# Patient Record
Sex: Female | Born: 1990
Health system: Southern US, Community
[De-identification: ages and names within clinical notes are randomized; demographics above are authoritative.]

## PROBLEM LIST (undated history)

## (undated) ENCOUNTER — Inpatient Hospital Stay (HOSPITAL_COMMUNITY): Payer: Self-pay

## (undated) DIAGNOSIS — F32A Depression, unspecified: Secondary | ICD-10-CM

## (undated) DIAGNOSIS — F329 Major depressive disorder, single episode, unspecified: Secondary | ICD-10-CM

## (undated) DIAGNOSIS — R51 Headache: Secondary | ICD-10-CM

## (undated) DIAGNOSIS — F419 Anxiety disorder, unspecified: Secondary | ICD-10-CM

## (undated) DIAGNOSIS — R519 Headache, unspecified: Secondary | ICD-10-CM

## (undated) DIAGNOSIS — T7840XA Allergy, unspecified, initial encounter: Secondary | ICD-10-CM

## (undated) DIAGNOSIS — R87619 Unspecified abnormal cytological findings in specimens from cervix uteri: Secondary | ICD-10-CM

## (undated) DIAGNOSIS — K589 Irritable bowel syndrome without diarrhea: Secondary | ICD-10-CM

## (undated) DIAGNOSIS — L408 Other psoriasis: Secondary | ICD-10-CM

## (undated) HISTORY — DX: Allergy, unspecified, initial encounter: T78.40XA

## (undated) HISTORY — PX: OTHER SURGICAL HISTORY: SHX169

## (undated) HISTORY — PX: TONSILLECTOMY: SUR1361

## (undated) HISTORY — PX: WISDOM TOOTH EXTRACTION: SHX21

## (undated) HISTORY — DX: Headache: R51

## (undated) HISTORY — DX: Headache, unspecified: R51.9

## (undated) HISTORY — DX: Depression, unspecified: F32.A

## (undated) HISTORY — DX: Anxiety disorder, unspecified: F41.9

## (undated) HISTORY — DX: Irritable bowel syndrome, unspecified: K58.9

## (undated) HISTORY — DX: Major depressive disorder, single episode, unspecified: F32.9

## (undated) HISTORY — DX: Other psoriasis: L40.8

## (undated) HISTORY — DX: Unspecified abnormal cytological findings in specimens from cervix uteri: R87.619

## (undated) NOTE — *Deleted (*Deleted)
Pt reports weight incorrectly input into the computere 7 lbs 10.4 oz.

---
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
## 2011-11-14 DIAGNOSIS — F339 Major depressive disorder, recurrent, unspecified: Secondary | ICD-10-CM | POA: Insufficient documentation

## 2012-05-07 ENCOUNTER — Other Ambulatory Visit: Payer: Self-pay | Admitting: Certified Nurse Midwife

## 2012-05-07 NOTE — Telephone Encounter (Signed)
Request for Junel from pharmacy patient last seen 05/18/11 not scheduled for this year. 1 month supply given.

## 2012-05-07 NOTE — Telephone Encounter (Signed)
Will need to schedule exam before any other refills

## 2012-06-10 ENCOUNTER — Other Ambulatory Visit: Payer: Self-pay | Admitting: Certified Nurse Midwife

## 2012-06-10 NOTE — Telephone Encounter (Signed)
Aex scheduled for 06/25/2012 @ 2:00. Junel Fe 1/20 #28/0 refills sent to pharmacy for pt to maintain until aex.

## 2012-06-21 ENCOUNTER — Encounter: Payer: Self-pay | Admitting: Certified Nurse Midwife

## 2012-06-25 ENCOUNTER — Encounter: Payer: Self-pay | Admitting: Certified Nurse Midwife

## 2012-06-25 ENCOUNTER — Ambulatory Visit (INDEPENDENT_AMBULATORY_CARE_PROVIDER_SITE_OTHER): Payer: BC Managed Care – PPO | Admitting: Certified Nurse Midwife

## 2012-06-25 VITALS — BP 100/60 | HR 60 | Resp 16 | Ht 67.75 in | Wt 151.0 lb

## 2012-06-25 DIAGNOSIS — Z01419 Encounter for gynecological examination (general) (routine) without abnormal findings: Secondary | ICD-10-CM

## 2012-06-25 DIAGNOSIS — Z309 Encounter for contraceptive management, unspecified: Secondary | ICD-10-CM

## 2012-06-25 DIAGNOSIS — Z0000 Encounter for general adult medical examination without abnormal findings: Secondary | ICD-10-CM

## 2012-06-25 DIAGNOSIS — IMO0001 Reserved for inherently not codable concepts without codable children: Secondary | ICD-10-CM

## 2012-06-25 MED ORDER — NORETHIN ACE-ETH ESTRAD-FE 1-20 MG-MCG PO TABS: ORAL_TABLET | ORAL | Status: DC

## 2012-06-25 NOTE — Progress Notes (Signed)
22 y.o. G0P0000 Single Caucasian Fe here for annual exam.  Periods normal. Contraception working well and desires continuance.  Not sexually active. No STD screening needed. Complaining of occasional hard stool with bleeding only at time of stool, then subsides, no pain or diarrhea.  Has increased fiber in diet and water daily with some change. Psoriasis flare in vaginal area today.  Has medication for with dermatology. Medication stable for anxiety and depression with PCP. No other health issues today.   Patient's last menstrual period was 06/03/2012.          Sexually active: no  The current method of family planning is OCP (estrogen/progesterone).    Exercising: yes  weights & cardio Smoker:  no  Health Maintenance: Pap:  none MMG:  none Colonoscopy:  none BMD:   none TDaP:  2010 Labs: none Self breast exam: occ   reports that she has never smoked. She does not have any smokeless tobacco history on file. She reports that she drinks about 0.5 ounces of alcohol per week. She reports that she does not use illicit drugs.  Past Medical History  Diagnosis Date  . Inverse psoriasis   . IBS (irritable bowel syndrome)     History reviewed. No pertinent past surgical history.  Current Outpatient Prescriptions  Medication Sig Dispense Refill  . Acetaminophen (TYLENOL PO) Take by mouth as needed.      . Betamethasone Valerate 0.12 % foam daily.      Marland Kitchen FLUoxetine HCl (PROZAC PO) Take 30 mg by mouth daily.       . Ibuprofen (ADVIL PO) Take by mouth as needed.      Colleen Can FE 1/20 1-20 MG-MCG tablet TAKE 1 TABLET BY MOUTH EVERY DAY  28 tablet  0  . lamoTRIgine (LAMICTAL) 100 MG tablet Take 100 mg by mouth daily.       No current facility-administered medications for this visit.    Family History  Problem Relation Age of Onset  . Cancer Father     skin  . Cancer Maternal Grandmother     liver  . Hypertension Paternal Grandmother     ROS:  Pertinent items are noted in HPI.   Otherwise, a comprehensive ROS was negative.  Exam:   BP 100/60  Pulse 60  Resp 16  Ht 5' 7.75" (1.721 m)  Wt 151 lb (68.493 kg)  BMI 23.13 kg/m2  LMP 06/03/2012 Height: 5' 7.75" (172.1 cm)  Ht Readings from Last 3 Encounters:  06/25/12 5' 7.75" (1.721 m)    General appearance: alert, cooperative and appears stated age Head: Normocephalic, without obvious abnormality, atraumatic Neck: no adenopathy, supple, symmetrical, trachea midline and thyroid normal to inspection and palpation Lungs: clear to auscultation bilaterally Breasts: normal appearance, no masses or tenderness, No nipple retraction or dimpling, No nipple discharge or bleeding, No axillary or supraclavicular adenopathy Heart: regular rate and rhythm Abdomen: soft, non-tender; no masses,  no organomegaly Extremities: extremities normal, atraumatic, no cyanosis or edema Skin: Skin color, texture, turgor normal. No rashes or lesions Lymph nodes: Cervical, supraclavicular, and axillary nodes normal. No abnormal inguinal nodes palpated Neurologic: Grossly normal   Pelvic: External genitalia:  no lesions              Urethra:  normal appearing urethra with no masses, tenderness or lesions              Bartholin's and Skene's: normal  Vagina: normal appearing vagina with normal color and discharge, no lesions              Cervix: normal , non tender              Pap taken: yes Bimanual Exam:  Uterus:  normal size, contour, position, consistency, mobility, non-tender and anteverted              Adnexa: normal adnexa and no mass, fullness, tenderness               Rectovaginal: Confirms               Anus:  normal sphincter tone, no lesions  A:  Well Woman with normal exam  Contraception: OCP   Psoriasis history with dermatology management  Anxiety/Depression stable medication    P:  Reviewed health and wellness pertinent to exam.  Rx Junel 1/20 Fe see order   Continue follow up as indicated     Pap  smear as per guidelines  pap smear taken today  counseled on breast self exam, STD prevention, use and side effects of OCP's, adequate intake of calcium and vitamin D, diet and exercise  return annually or prn  An After Visit Summary was printed and given to the patient.  Reviewed, TL

## 2012-06-25 NOTE — Patient Instructions (Addendum)
General topics  Next pap or exam is  due in 1 year Take a Women's multivitamin Take 1200 mg. of calcium daily - prefer dietary If any concerns in interim to call back  Breast Self-Awareness Practicing breast self-awareness may pick up problems early, prevent significant medical complications, and possibly save your life. By practicing breast self-awareness, you can become familiar with how your breasts look and feel and if your breasts are changing. This allows you to notice changes early. It can also offer you some reassurance that your breast health is good. One way to learn what is normal for your breasts and whether your breasts are changing is to do a breast self-exam. If you find a lump or something that was not present in the past, it is best to contact your caregiver right away. Other findings that should be evaluated by your caregiver include nipple discharge, especially if it is bloody; skin changes or reddening; areas where the skin seems to be pulled in (retracted); or new lumps and bumps. Breast pain is seldom associated with cancer (malignancy), but should also be evaluated by a caregiver. BREAST SELF-EXAM The best time to examine your breasts is 5 7 days after your menstrual period is over.  ExitCare Patient Information 2013 [employer].   Exercise to Stay Healthy Exercise helps you become and stay healthy. EXERCISE IDEAS AND TIPS Choose exercises that:  You enjoy.  Fit into your day. You do not need to exercise really hard to be healthy. You can do exercises at a slow or medium level and stay healthy. You can:  Stretch before and after working out.  Try yoga, Pilates, or tai chi.  Lift weights.  Walk fast, swim, jog, run, climb stairs, bicycle, dance, or rollerskate.  Take aerobic classes. Exercises that burn about 150 calories:  Running 1  miles in 15 minutes.  Playing volleyball for 45 to 60 minutes.  Washing and waxing a car for 45 to 60  minutes.  Playing touch football for 45 minutes.  Walking 1  miles in 35 minutes.  Pushing a stroller 1  miles in 30 minutes.  Playing basketball for 30 minutes.  Raking leaves for 30 minutes.  Bicycling 5 miles in 30 minutes.  Walking 2 miles in 30 minutes.  Dancing for 30 minutes.  Shoveling snow for 15 minutes.  Swimming laps for 20 minutes.  Walking up stairs for 15 minutes.  Bicycling 4 miles in 15 minutes.  Gardening for 30 to 45 minutes.  Jumping rope for 15 minutes.  Washing windows or floors for 45 to 60 minutes. Document Released: 01/21/2010 Document Revised: 03/13/2011 Document Reviewed: 01/21/2010 ExitCare Patient Information 2013 [employer].   Other topics ( that may be useful information):    Sexually Transmitted Disease Sexually transmitted disease (STD) refers to any infection that is passed from person to person during sexual activity. This may happen by way of saliva, semen, blood, vaginal mucus, or urine. Common STDs include:  Gonorrhea.  Chlamydia.  Syphilis.  HIV/AIDS.  Genital herpes.  Hepatitis B and C.  Trichomonas.  Human papillomavirus (HPV).  Pubic lice. CAUSES  An STD may be spread by bacteria, virus, or parasite. A person can get an STD by:  Sexual intercourse with an infected person.  Sharing sex toys with an infected person.  Sharing needles with an infected person.  Having intimate contact with the genitals, mouth, or rectal areas of an infected person. SYMPTOMS  Some people may not have any symptoms, but   they can still pass the infection to others. Different STDs have different symptoms. Symptoms include:  Painful or bloody urination.  Pain in the pelvis, abdomen, vagina, anus, throat, or eyes.  Skin rash, itching, irritation, growths, or sores (lesions). These usually occur in the genital or anal area.  Abnormal vaginal discharge.  Penile discharge in men.  Soft, flesh-colored skin growths in the  genital or anal area.  Fever.  Pain or bleeding during sexual intercourse.  Swollen glands in the groin area.  Yellow skin and eyes (jaundice). This is seen with hepatitis. DIAGNOSIS  To make a diagnosis, your caregiver may:  Take a medical history.  Perform a physical exam.  Take a specimen (culture) to be examined.  Examine a sample of discharge under a microscope.  Perform blood test TREATMENT   Chlamydia, gonorrhea, trichomonas, and syphilis can be cured with antibiotic medicine.  Genital herpes, hepatitis, and HIV can be treated, but not cured, with prescribed medicines. The medicines will lessen the symptoms.  Genital warts from HPV can be treated with medicine or by freezing, burning (electrocautery), or surgery. Warts may come back.  HPV is a virus and cannot be cured with medicine or surgery.However, abnormal areas may be followed very closely by your caregiver and may be removed from the cervix, vagina, or vulva through office procedures or surgery. If your diagnosis is confirmed, your recent sexual partners need treatment. This is true even if they are symptom-free or have a negative culture or evaluation. They should not have sex until their caregiver says it is okay. HOME CARE INSTRUCTIONS  All sexual partners should be informed, tested, and treated for all STDs.  Take your antibiotics as directed. Finish them even if you start to feel better.  Only take over-the-counter or prescription medicines for pain, discomfort, or fever as directed by your caregiver.  Rest.  Eat a balanced diet and drink enough fluids to keep your urine clear or pale yellow.  Do not have sex until treatment is completed and you have followed up with your caregiver. STDs should be checked after treatment.  Keep all follow-up appointments, Pap tests, and blood tests as directed by your caregiver.  Only use latex condoms and water-soluble lubricants during sexual activity. Do not use  petroleum jelly or oils.  Avoid alcohol and illegal drugs.  Get vaccinated for HPV and hepatitis. If you have not received these vaccines in the past, talk to your caregiver about whether one or both might be right for you.  Avoid risky sex practices that can break the skin. The only way to avoid getting an STD is to avoid all sexual activity.Latex condoms and dental dams (for oral sex) will help lessen the risk of getting an STD, but will not completely eliminate the risk. SEEK MEDICAL CARE IF:   You have a fever.  You have any new or worsening symptoms. Document Released: 03/11/2002 Document Revised: 03/13/2011 Document Reviewed: 03/18/2010 ExitCare Patient Information 2013 [employer].    Domestic Abuse You are being battered or abused if someone close to you hits, pushes, or physically hurts you in any way. You also are being abused if you are forced into activities. You are being sexually abused if you are forced to have sexual contact of any kind. You are being emotionally abused if you are made to feel worthless or if you are constantly threatened. It is important to remember that help is available. No one has the right to abuse you. PREVENTION OF FURTHER   ABUSE  Learn the warning signs of danger. This varies with situations but may include: the use of alcohol, threats, isolation from friends and family, or forced sexual contact. Leave if you feel that violence is going to occur.  If you are attacked or beaten, report it to the police so the abuse is documented. You do not have to press charges. The police can protect you while you or the attackers are leaving. Get the officer's name and badge number and a copy of the report.  Find someone you can trust and tell them what is happening to you: your caregiver, a nurse, clergy member, close friend or family member. Feeling ashamed is natural, but remember that you have done nothing wrong. No one deserves abuse. Document Released:  12/17/1999 Document Revised: 03/13/2011 Document Reviewed: 02/24/2010 ExitCare Patient Information 2013 [employer].    How Much is Too Much Alcohol? Drinking too much alcohol can cause injury, accidents, and health problems. These types of problems can include:   Car crashes.  Falls.  Family fighting (domestic violence).  Drowning.  Fights.  Injuries.  Burns.  Damage to certain organs.  Having a baby with birth defects. ONE DRINK CAN BE TOO MUCH WHEN YOU ARE:  Working.  Pregnant or breastfeeding.  Taking medicines. Ask your doctor.  Driving or planning to drive. If you or someone you know has a drinking problem, get help from a doctor.  Document Released: 10/15/2008 Document Revised: 03/13/2011 Document Reviewed: 10/15/2008 ExitCare Patient Information 2013 [employer].   Smoking Hazards Smoking cigarettes is extremely bad for your health. Tobacco smoke has over 200 known poisons in it. There are over 60 chemicals in tobacco smoke that cause cancer. Some of the chemicals found in cigarette smoke include:   Cyanide.  Benzene.  Formaldehyde.  Methanol (wood alcohol).  Acetylene (fuel used in welding torches).  Ammonia. Cigarette smoke also contains the poisonous gases nitrogen oxide and carbon monoxide.  Cigarette smokers have an increased risk of many serious medical problems and Smoking causes approximately:  90% of all lung cancer deaths in men.  80% of all lung cancer deaths in women.  90% of deaths from chronic obstructive lung disease. Compared with nonsmokers, smoking increases the risk of:  Coronary heart disease by 2 to 4 times.  Stroke by 2 to 4 times.  Men developing lung cancer by 23 times.  Women developing lung cancer by 13 times.  Dying from chronic obstructive lung diseases by 12 times.  . Smoking is the most preventable cause of death and disease in our society.  WHY IS SMOKING ADDICTIVE?  Nicotine is the chemical  agent in tobacco that is capable of causing addiction or dependence.  When you smoke and inhale, nicotine is absorbed rapidly into the bloodstream through your lungs. Nicotine absorbed through the lungs is capable of creating a powerful addiction. Both inhaled and non-inhaled nicotine may be addictive.  Addiction studies of cigarettes and spit tobacco show that addiction to nicotine occurs mainly during the teen years, when young people begin using tobacco products. WHAT ARE THE BENEFITS OF QUITTING?  There are many health benefits to quitting smoking.   Likelihood of developing cancer and heart disease decreases. Health improvements are seen almost immediately.  Blood pressure, pulse rate, and breathing patterns start returning to normal soon after quitting. QUITTING SMOKING   American Lung Association - 1-800-LUNGUSA  American Cancer Society - 1-800-ACS-2345 Document Released: 01/27/2004 Document Revised: 03/13/2011 Document Reviewed: 09/30/2008 ExitCare Patient Information 2013 [employer].   Stress Management Stress is a state of physical or mental tension that often results from changes in your life or normal routine. Some common causes of stress are:  Death of a loved one.  Injuries or severe illnesses.  Getting fired or changing jobs.  Moving into a new home. Other causes may be:  Sexual problems.  Business or financial losses.  Taking on a large debt.  Regular conflict with someone at home or at work.  Constant tiredness from lack of sleep. It is not just bad things that are stressful. It may be stressful to:  Win the lottery.  Get married.  Buy a new car. The amount of stress that can be easily tolerated varies from person to person. Changes generally cause stress, regardless of the types of change. Too much stress can affect your health. It may lead to physical or emotional problems. Too little stress (boredom) may also become stressful. SUGGESTIONS TO  REDUCE STRESS:  Talk things over with your family and friends. It often is helpful to share your concerns and worries. If you feel your problem is serious, you may want to get help from a professional counselor.  Consider your problems one at a time instead of lumping them all together. Trying to take care of everything at once may seem impossible. List all the things you need to do and then start with the most important one. Set a goal to accomplish 2 or 3 things each day. If you expect to do too many in a single day you will naturally fail, causing you to feel even more stressed.  Do not use alcohol or drugs to relieve stress. Although you may feel better for a short time, they do not remove the problems that caused the stress. They can also be habit forming.  Exercise regularly - at least 3 times per week. Physical exercise can help to relieve that "uptight" feeling and will relax you.  The shortest distance between despair and hope is often a good night's sleep.  Go to bed and get up on time allowing yourself time for appointments without being rushed.  Take a short "time-out" period from any stressful situation that occurs during the day. Close your eyes and take some deep breaths. Starting with the muscles in your face, tense them, hold it for a few seconds, then relax. Repeat this with the muscles in your neck, shoulders, hand, stomach, back and legs.  Take good care of yourself. Eat a balanced diet and get plenty of rest.  Schedule time for having fun. Take a break from your daily routine to relax. HOME CARE INSTRUCTIONS   Call if you feel overwhelmed by your problems and feel you can no longer manage them on your own.  Return immediately if you feel like hurting yourself or someone else. Document Released: 06/14/2000 Document Revised: 03/13/2011 Document Reviewed: 02/04/2007 ExitCare Patient Information 2013 [employer].   

## 2012-09-28 ENCOUNTER — Ambulatory Visit (INDEPENDENT_AMBULATORY_CARE_PROVIDER_SITE_OTHER): Payer: BC Managed Care – PPO | Admitting: Family Medicine

## 2012-09-28 VITALS — BP 110/70 | HR 72 | Temp 97.8°F | Resp 16 | Ht 68.5 in | Wt 144.0 lb

## 2012-09-28 DIAGNOSIS — J329 Chronic sinusitis, unspecified: Secondary | ICD-10-CM

## 2012-09-28 MED ORDER — AMOXICILLIN 875 MG PO TABS: 875.0000 mg | ORAL_TABLET | Freq: Two times a day (BID) | ORAL | Status: DC

## 2012-09-28 NOTE — Progress Notes (Signed)
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  She has 5 days of progressive sinus headache, congestion (better today), green discharge and a couple days of abdominal cramping.    Objective:  NAD Nose:  Marked swelling TM's:  Slight retraction Sinus tenderness left side Oroph:  Clear Neck: supple, no adenop or thyromegaly  Assessment:  Sinusitis Sinusitis - Plan: amoxicillin (AMOXIL) 875 MG tablet  Signed, Elvina Sidle, MD

## 2012-09-28 NOTE — Patient Instructions (Signed)

## 2013-06-26 ENCOUNTER — Encounter: Payer: Self-pay | Admitting: Certified Nurse Midwife

## 2013-06-26 ENCOUNTER — Ambulatory Visit (INDEPENDENT_AMBULATORY_CARE_PROVIDER_SITE_OTHER): Payer: BC Managed Care – PPO | Admitting: Certified Nurse Midwife

## 2013-06-26 VITALS — BP 104/68 | HR 64 | Resp 16 | Ht 68.25 in | Wt 155.0 lb

## 2013-06-26 DIAGNOSIS — Z01419 Encounter for gynecological examination (general) (routine) without abnormal findings: Secondary | ICD-10-CM

## 2013-06-26 DIAGNOSIS — Z0000 Encounter for general adult medical examination without abnormal findings: Secondary | ICD-10-CM

## 2013-06-26 DIAGNOSIS — Z309 Encounter for contraceptive management, unspecified: Secondary | ICD-10-CM

## 2013-06-26 MED ORDER — NORETHIN ACE-ETH ESTRAD-FE 1-20 MG-MCG PO TABS: ORAL_TABLET | ORAL | Status: DC

## 2013-06-26 NOTE — Progress Notes (Signed)
23 y.o. G0P0000 Single Caucasian Fe here for annual exam.  Periods normal, no issues. No partner change. Contraception working well. No STD concerns or testing desired. No health issues today. Would like information on Nexplanon, may like to change to longer acting.   Patient's last menstrual period was 05/31/2013.          Sexually active: Yes.    The current method of family planning is OCP (estrogen/progesterone).    Exercising: Yes.    cardio & weights Smoker:  no  Health Maintenance: Pap: 06-25-12 neg MMG:  none Colonoscopy:  none BMD:   none TDaP: 2010 Labs: Hgb-12.6  Self breast exam:done monthly   reports that she has never smoked. She does not have any smokeless tobacco history on file. She reports that she drinks about 2 - 2.5 ounces of alcohol per week. She reports that she does not use illicit drugs.  Past Medical History  Diagnosis Date  . Inverse psoriasis   . IBS (irritable bowel syndrome)   . Allergy   . Depression   . Anxiety     History reviewed. No pertinent past surgical history.  Current Outpatient Prescriptions  Medication Sig Dispense Refill  . Acetaminophen (TYLENOL PO) Take by mouth as needed.      . Betamethasone Valerate 0.12 % foam daily.      Marland Kitchen. FLUoxetine HCl (PROZAC PO) Take 30 mg by mouth daily.       . Ibuprofen (ADVIL PO) Take by mouth as needed.      . lamoTRIgine (LAMICTAL) 100 MG tablet Take 100 mg by mouth daily.      . norethindrone-ethinyl estradiol (JUNEL FE 1/20) 1-20 MG-MCG tablet TAKE 1 TABLET BY MOUTH EVERY DAY  1 Package  12   No current facility-administered medications for this visit.    Family History  Problem Relation Age of Onset  . Cancer Father     skin  . Hypertension Father   . Cancer Maternal Grandmother     liver  . Hypertension Paternal Grandmother   . Asthma Brother     ROS:  Pertinent items are noted in HPI.  Otherwise, a comprehensive ROS was negative.  Exam:   BP 104/68  Pulse 64  Resp 16  Ht 5'  8.25" (1.734 m)  Wt 155 lb (70.308 kg)  BMI 23.38 kg/m2  LMP 05/31/2013 Height: 5' 8.25" (173.4 cm)  Ht Readings from Last 3 Encounters:  06/26/13 5' 8.25" (1.734 m)  09/28/12 5' 8.5" (1.74 m)  06/25/12 5' 7.75" (1.721 m)    General appearance: alert, cooperative and appears stated age Head: Normocephalic, without obvious abnormality, atraumatic Neck: no adenopathy, supple, symmetrical, trachea midline and thyroid normal to inspection and palpation and non-palpable Lungs: clear to auscultation bilaterally Breasts: normal appearance, no masses or tenderness, No nipple retraction or dimpling, No nipple discharge or bleeding, No axillary or supraclavicular adenopathy Heart: regular rate and rhythm Abdomen: soft, non-tender; no masses,  no organomegaly Extremities: extremities normal, atraumatic, no cyanosis or edema Skin: Skin color, texture, turgor normal. No rashes or lesions Lymph nodes: Cervical, supraclavicular, and axillary nodes normal. No abnormal inguinal nodes palpated Neurologic: Grossly normal   Pelvic: External genitalia:  no lesions              Urethra:  normal appearing urethra with no masses, tenderness or lesions              Bartholin's and Skene's: normal  Vagina: normal appearing vagina with normal color and discharge, no lesions              Cervix: normal, non tender              Pap taken: No. Bimanual Exam:  Uterus:  normal size, contour, position, consistency, mobility, non-tender and anteverted              Adnexa: normal adnexa and no mass, fullness, tenderness               Rectovaginal: Confirms               Anus:  normal sphincter tone, no lesions  A:  Well Woman with normal exam  Contraception desires OCP at this time  P:   Reviewed health and wellness pertinent to exam  Rx Junel 1/20 Fe see order  Given information pamphlet on Nexplanon and insurance sheet. Discussed risks/benefits, bleeding expectations, insertion/removal and need  insertion on day 1-5 of period. Patient will call if decides to change.  Pap smear not taken today Reviewed STD prevention, good diet and exercise.  return annually or prn  An After Visit Summary was printed and given to the patient.

## 2013-06-26 NOTE — Patient Instructions (Signed)
General topics  Next pap or exam is  due in 1 year Take a Women's multivitamin Take 1200 mg. of calcium daily - prefer dietary If any concerns in interim to call back  Breast Self-Awareness Practicing breast self-awareness may pick up problems early, prevent significant medical complications, and possibly save your life. By practicing breast self-awareness, you can become familiar with how your breasts look and feel and if your breasts are changing. This allows you to notice changes early. It can also offer you some reassurance that your breast health is good. One way to learn what is normal for your breasts and whether your breasts are changing is to do a breast self-exam. If you find a lump or something that was not present in the past, it is best to contact your caregiver right away. Other findings that should be evaluated by your caregiver include nipple discharge, especially if it is bloody; skin changes or reddening; areas where the skin seems to be pulled in (retracted); or new lumps and bumps. Breast pain is seldom associated with cancer (malignancy), but should also be evaluated by a caregiver. BREAST SELF-EXAM The best time to examine your breasts is 5 7 days after your menstrual period is over.  ExitCare Patient Information 2013 [employer].   Exercise to Stay Healthy Exercise helps you become and stay healthy. EXERCISE IDEAS AND TIPS Choose exercises that:  You enjoy.  Fit into your day. You do not need to exercise really hard to be healthy. You can do exercises at a slow or medium level and stay healthy. You can:  Stretch before and after working out.  Try yoga, Pilates, or tai chi.  Lift weights.  Walk fast, swim, jog, run, climb stairs, bicycle, dance, or rollerskate.  Take aerobic classes. Exercises that burn about 150 calories:  Running 1  miles in 15 minutes.  Playing volleyball for 45 to 60 minutes.  Washing and waxing a car for 45 to 60  minutes.  Playing touch football for 45 minutes.  Walking 1  miles in 35 minutes.  Pushing a stroller 1  miles in 30 minutes.  Playing basketball for 30 minutes.  Raking leaves for 30 minutes.  Bicycling 5 miles in 30 minutes.  Walking 2 miles in 30 minutes.  Dancing for 30 minutes.  Shoveling snow for 15 minutes.  Swimming laps for 20 minutes.  Walking up stairs for 15 minutes.  Bicycling 4 miles in 15 minutes.  Gardening for 30 to 45 minutes.  Jumping rope for 15 minutes.  Washing windows or floors for 45 to 60 minutes. Document Released: 01/21/2010 Document Revised: 03/13/2011 Document Reviewed: 01/21/2010 ExitCare Patient Information 2013 [employer].   Other topics ( that may be useful information):    Sexually Transmitted Disease Sexually transmitted disease (STD) refers to any infection that is passed from person to person during sexual activity. This may happen by way of saliva, semen, blood, vaginal mucus, or urine. Common STDs include:  Gonorrhea.  Chlamydia.  Syphilis.  HIV/AIDS.  Genital herpes.  Hepatitis B and C.  Trichomonas.  Human papillomavirus (HPV).  Pubic lice. CAUSES  An STD may be spread by bacteria, virus, or parasite. A person can get an STD by:  Sexual intercourse with an infected person.  Sharing sex toys with an infected person.  Sharing needles with an infected person.  Having intimate contact with the genitals, mouth, or rectal areas of an infected person. SYMPTOMS  Some people may not have any symptoms, but   they can still pass the infection to others. Different STDs have different symptoms. Symptoms include:  Painful or bloody urination.  Pain in the pelvis, abdomen, vagina, anus, throat, or eyes.  Skin rash, itching, irritation, growths, or sores (lesions). These usually occur in the genital or anal area.  Abnormal vaginal discharge.  Penile discharge in men.  Soft, flesh-colored skin growths in the  genital or anal area.  Fever.  Pain or bleeding during sexual intercourse.  Swollen glands in the groin area.  Yellow skin and eyes (jaundice). This is seen with hepatitis. DIAGNOSIS  To make a diagnosis, your caregiver may:  Take a medical history.  Perform a physical exam.  Take a specimen (culture) to be examined.  Examine a sample of discharge under a microscope.  Perform blood test TREATMENT   Chlamydia, gonorrhea, trichomonas, and syphilis can be cured with antibiotic medicine.  Genital herpes, hepatitis, and HIV can be treated, but not cured, with prescribed medicines. The medicines will lessen the symptoms.  Genital warts from HPV can be treated with medicine or by freezing, burning (electrocautery), or surgery. Warts may come back.  HPV is a virus and cannot be cured with medicine or surgery.However, abnormal areas may be followed very closely by your caregiver and may be removed from the cervix, vagina, or vulva through office procedures or surgery. If your diagnosis is confirmed, your recent sexual partners need treatment. This is true even if they are symptom-free or have a negative culture or evaluation. They should not have sex until their caregiver says it is okay. HOME CARE INSTRUCTIONS  All sexual partners should be informed, tested, and treated for all STDs.  Take your antibiotics as directed. Finish them even if you start to feel better.  Only take over-the-counter or prescription medicines for pain, discomfort, or fever as directed by your caregiver.  Rest.  Eat a balanced diet and drink enough fluids to keep your urine clear or pale yellow.  Do not have sex until treatment is completed and you have followed up with your caregiver. STDs should be checked after treatment.  Keep all follow-up appointments, Pap tests, and blood tests as directed by your caregiver.  Only use latex condoms and water-soluble lubricants during sexual activity. Do not use  petroleum jelly or oils.  Avoid alcohol and illegal drugs.  Get vaccinated for HPV and hepatitis. If you have not received these vaccines in the past, talk to your caregiver about whether one or both might be right for you.  Avoid risky sex practices that can break the skin. The only way to avoid getting an STD is to avoid all sexual activity.Latex condoms and dental dams (for oral sex) will help lessen the risk of getting an STD, but will not completely eliminate the risk. SEEK MEDICAL CARE IF:   You have a fever.  You have any new or worsening symptoms. Document Released: 03/11/2002 Document Revised: 03/13/2011 Document Reviewed: 03/18/2010 [facility] Patient Information 2013 [employer].    Domestic Abuse You are being battered or abused if someone close to you hits, pushes, or physically hurts you in any way. You also are being abused if you are forced into activities. You are being sexually abused if you are forced to have sexual contact of any kind. You are being emotionally abused if you are made to feel worthless or if you are constantly threatened. It is important to remember that help is available. No one has the right to abuse you. PREVENTION OF FURTHER  ABUSE  Learn the warning signs of danger. This varies with situations but may include: the use of alcohol, threats, isolation from friends and family, or forced sexual contact. Leave if you feel that violence is going to occur.  If you are attacked or beaten, report it to the police so the abuse is documented. You do not have to press charges. The police can protect you while you or the attackers are leaving. Get the officer's name and badge number and a copy of the report.  Find someone you can trust and tell them what is happening to you: your caregiver, a nurse, clergy member, close friend or family member. Feeling ashamed is natural, but remember that you have done nothing wrong. No one deserves abuse. Document Released:  12/17/1999 Document Revised: 03/13/2011 Document Reviewed: 02/24/2010 ExitCare Patient Information 2013 [employer].    How Much is Too Much Alcohol? Drinking too much alcohol can cause injury, accidents, and health problems. These types of problems can include:   Car crashes.  Falls.  Family fighting (domestic violence).  Drowning.  Fights.  Injuries.  Burns.  Damage to certain organs.  Having a baby with birth defects. ONE DRINK CAN BE TOO MUCH WHEN YOU ARE:  Working.  Pregnant or breastfeeding.  Taking medicines. Ask your doctor.  Driving or planning to drive. If you or someone you know has a drinking problem, get help from a doctor.  Document Released: 10/15/2008 Document Revised: 03/13/2011 Document Reviewed: 10/15/2008 ExitCare Patient Information 2013 [employer].   Smoking Hazards Smoking cigarettes is extremely bad for your health. Tobacco smoke has over 200 known poisons in it. There are over 60 chemicals in tobacco smoke that cause cancer. Some of the chemicals found in cigarette smoke include:   Cyanide.  Benzene.  Formaldehyde.  Methanol (wood alcohol).  Acetylene (fuel used in welding torches).  Ammonia. Cigarette smoke also contains the poisonous gases nitrogen oxide and carbon monoxide.  Cigarette smokers have an increased risk of many serious medical problems and Smoking causes approximately:  90% of all lung cancer deaths in men.  80% of all lung cancer deaths in women.  90% of deaths from chronic obstructive lung disease. Compared with nonsmokers, smoking increases the risk of:  Coronary heart disease by 2 to 4 times.  Stroke by 2 to 4 times.  Men developing lung cancer by 23 times.  Women developing lung cancer by 13 times.  Dying from chronic obstructive lung diseases by 12 times.  . Smoking is the most preventable cause of death and disease in our society.  WHY IS SMOKING ADDICTIVE?  Nicotine is the chemical  agent in tobacco that is capable of causing addiction or dependence.  When you smoke and inhale, nicotine is absorbed rapidly into the bloodstream through your lungs. Nicotine absorbed through the lungs is capable of creating a powerful addiction. Both inhaled and non-inhaled nicotine may be addictive.  Addiction studies of cigarettes and spit tobacco show that addiction to nicotine occurs mainly during the teen years, when young people begin using tobacco products. WHAT ARE THE BENEFITS OF QUITTING?  There are many health benefits to quitting smoking.   Likelihood of developing cancer and heart disease decreases. Health improvements are seen almost immediately.  Blood pressure, pulse rate, and breathing patterns start returning to normal soon after quitting. QUITTING SMOKING   American Lung Association - 1-800-LUNGUSA  American Cancer Society - 1-800-ACS-2345 Document Released: 01/27/2004 Document Revised: 03/13/2011 Document Reviewed: 09/30/2008 ExitCare Patient Information 2013 ExitCare,   LLC.   Stress Management Stress is a state of physical or mental tension that often results from changes in your life or normal routine. Some common causes of stress are:  Death of a loved one.  Injuries or severe illnesses.  Getting fired or changing jobs.  Moving into a new home. Other causes may be:  Sexual problems.  Business or financial losses.  Taking on a large debt.  Regular conflict with someone at home or at work.  Constant tiredness from lack of sleep. It is not just bad things that are stressful. It may be stressful to:  Win the lottery.  Get married.  Buy a new car. The amount of stress that can be easily tolerated varies from person to person. Changes generally cause stress, regardless of the types of change. Too much stress can affect your health. It may lead to physical or emotional problems. Too little stress (boredom) may also become stressful. SUGGESTIONS TO  REDUCE STRESS:  Talk things over with your family and friends. It often is helpful to share your concerns and worries. If you feel your problem is serious, you may want to get help from a professional counselor.  Consider your problems one at a time instead of lumping them all together. Trying to take care of everything at once may seem impossible. List all the things you need to do and then start with the most important one. Set a goal to accomplish 2 or 3 things each day. If you expect to do too many in a single day you will naturally fail, causing you to feel even more stressed.  Do not use alcohol or drugs to relieve stress. Although you may feel better for a short time, they do not remove the problems that caused the stress. They can also be habit forming.  Exercise regularly - at least 3 times per week. Physical exercise can help to relieve that "uptight" feeling and will relax you.  The shortest distance between despair and hope is often a good night's sleep.  Go to bed and get up on time allowing yourself time for appointments without being rushed.  Take a short "time-out" period from any stressful situation that occurs during the day. Close your eyes and take some deep breaths. Starting with the muscles in your face, tense them, hold it for a few seconds, then relax. Repeat this with the muscles in your neck, shoulders, hand, stomach, back and legs.  Take good care of yourself. Eat a balanced diet and get plenty of rest.  Schedule time for having fun. Take a break from your daily routine to relax. HOME CARE INSTRUCTIONS   Call if you feel overwhelmed by your problems and feel you can no longer manage them on your own.  Return immediately if you feel like hurting yourself or someone else. Document Released: 06/14/2000 Document Revised: 03/13/2011 Document Reviewed: 02/04/2007 ExitCare Patient Information 2013 ExitCare[employer].   

## 2013-06-27 LAB — HEMOGLOBIN, FINGERSTICK: Hemoglobin, fingerstick: 12.6 g/dL (ref 12.0–16.0)

## 2013-06-30 NOTE — Progress Notes (Signed)
Reviewed personally.  M. Suzanne Kais Monje, MD.  

## 2013-07-01 ENCOUNTER — Other Ambulatory Visit: Payer: Self-pay | Admitting: Certified Nurse Midwife

## 2013-07-02 ENCOUNTER — Telehealth: Payer: Self-pay | Admitting: Certified Nurse Midwife

## 2013-07-02 NOTE — Telephone Encounter (Signed)
Pt started her cycle today and was told to call in so she could schedule an appointment for an IUD in her arm.

## 2013-07-03 NOTE — Telephone Encounter (Signed)
Spoke with patient. Saw Melanie Oneill CNM for annual on 06/26/13 and discussed nexplanon. She has started her cycle on 07/02/13  and would like to schedule nexplanon. Advised patient that she will need to come in for a consult with a doctor prior to having nexplanon inserted. Advised patient this appointment is for consult to discuss nexplanon only as this is needed prior to insertion. She is agreeable to this.  Office visit scheduled for 07/07/13 at 1530.  ,Routing to provider for final review. Patient agreeable to disposition. Will close encounter

## 2013-07-07 ENCOUNTER — Encounter: Payer: Self-pay | Admitting: Obstetrics and Gynecology

## 2013-07-07 ENCOUNTER — Ambulatory Visit (INDEPENDENT_AMBULATORY_CARE_PROVIDER_SITE_OTHER): Payer: BC Managed Care – PPO | Admitting: Obstetrics and Gynecology

## 2013-07-07 VITALS — BP 100/60 | HR 62 | Ht 68.25 in | Wt 154.6 lb

## 2013-07-07 DIAGNOSIS — Z3009 Encounter for other general counseling and advice on contraception: Secondary | ICD-10-CM

## 2013-07-07 NOTE — Patient Instructions (Signed)
We will call you with insurance information about the Nexplanon.

## 2013-07-07 NOTE — Progress Notes (Signed)
Patient ID: Melanie Oneill, female   DOB: 1990/04/29, 23 y.o.   MRN: [id number] GYNECOLOGY  VISIT   HPI: 23 y.o.   Single  Caucasian  female   [id number] with Patient's last menstrual period was 07/02/2013.   here for  Consultation regarding Nexplanon. Junel Fe 1/20 for the last 5 - 6 years.  Difficulty with taking on time within 3 hours.  Also using condoms.  Steady relationship.   Status she does OK with procedures.  Some anxiety and panic disorder. Last panic attack was about one year ago.  Takes Prozac.   GYNECOLOGIC HISTORY: Patient's last menstrual period was 07/02/2013. Contraception:   Junel Fe 1/20 and condoms.   Menopausal hormone therapy: n/a        OB History   Grav Para Term Preterm Abortions TAB SAB Ect Mult Living   0 0 0 0 0 0 0 0 0 0          There are no active problems to display for this patient.   Past Medical History  Diagnosis Date  . Inverse psoriasis   . IBS (irritable bowel syndrome)   . Allergy   . Depression   . Anxiety     History reviewed. No pertinent past surgical history.  Current Outpatient Prescriptions  Medication Sig Dispense Refill  . Acetaminophen (TYLENOL PO) Take by mouth as needed.      . Betamethasone Valerate 0.12 % foam daily.      Marland Kitchen. FLUoxetine HCl (PROZAC PO) Take 30 mg by mouth daily.       . Ibuprofen (ADVIL PO) Take by mouth as needed.      . lamoTRIgine (LAMICTAL) 100 MG tablet Take 100 mg by mouth daily.      . norethindrone-ethinyl estradiol (JUNEL FE 1/20) 1-20 MG-MCG tablet TAKE 1 TABLET BY MOUTH EVERY DAY  1 Package  12   No current facility-administered medications for this visit.     ALLERGIES: Wheat bran  Family History  Problem Relation Age of Onset  . Cancer Father     skin  . Hypertension Father   . Cancer Maternal Grandmother     liver  . Hypertension Paternal Grandmother   . Asthma Brother     History   Social History  . Marital Status: Single    Spouse Name: N/A    Number of Children: N/A   . Years of Education: N/A   Occupational History  . Not on file.   Social History Main Topics  . Smoking status: Never Smoker   . Smokeless tobacco: Not on file  . Alcohol Use: 2.0 - 2.5 oz/week    4-5 drink(s) per week  . Drug Use: No  . Sexual Activity: Yes    Partners: Male    Birth Control/ Protection: Pill   Other Topics Concern  . Not on file   Social History Narrative  . No narrative on file    ROS:  Pertinent items are noted in HPI.  PHYSICAL EXAMINATION:    BP 100/60  Pulse 62  Ht 5' 8.25" (1.734 m)  Wt 154 lb 9.6 oz (70.126 kg)  BMI 23.32 kg/m2  LMP 07/02/2013     General appearance: alert, cooperative and appears stated age   ASSESSMENT  Desire for long term contraception.  PLAN  I discussed Nexplanon, ParaGard IUD, Skyla IUD, Depo Provera, NuvaRing, and OrthoEvra options for contraception.  Patient desires Nexplanon after full discussion of risks and benefits. Will precert with her insurance and  schedule for the next menstruation.    An After Visit Summary was printed and given to the patient.  __20____ minutes face to face time of which over 50% was spent in counseling.

## 2013-07-08 ENCOUNTER — Telehealth: Payer: Self-pay | Admitting: Obstetrics and Gynecology

## 2013-07-08 NOTE — Telephone Encounter (Signed)
Left message for patient to call back. Need to go over Nexplanon benefits.

## 2013-08-21 ENCOUNTER — Telehealth: Payer: Self-pay | Admitting: Certified Nurse Midwife

## 2013-08-21 DIAGNOSIS — Z30017 Encounter for initial prescription of implantable subdermal contraceptive: Secondary | ICD-10-CM

## 2013-08-21 DIAGNOSIS — Z3049 Encounter for surveillance of other contraceptives: Secondary | ICD-10-CM

## 2013-08-21 NOTE — Telephone Encounter (Signed)
Patient saying she never got a call about nexplanon.

## 2013-08-22 NOTE — Telephone Encounter (Signed)
Routing to [place] as patient was called on 07/08/13 to discuss.

## 2013-08-25 NOTE — Telephone Encounter (Signed)
Left message for patient to call back. PR for nexplanon is $20 copay

## 2013-09-22 ENCOUNTER — Telehealth: Payer: Self-pay | Admitting: Certified Nurse Midwife

## 2013-09-22 NOTE — Telephone Encounter (Signed)
Spoke with patient. Advised that per benefits quote received, IUD and insertion is covered at 100% after a $20 copay. Patient is to call within the first 5 days of her cycle to schedule insertion. Patient states that she is currently within the first 5 days of cycle. Passed call to [facility] for scheduling

## 2013-09-22 NOTE — Telephone Encounter (Signed)
Patient returning Sabrina's call. Last phone note closed in error, details below:    Call Documentation      Vangie Bicker at 08/25/2013  9:58 AM      Status: Signed            Left message for patient to call back. PR for nexplanon is $20 copay         Almedia Balls, RN at 08/22/2013  8:09 AM      Status: Signed            Routing to [place] as patient was called on 07/08/13 to discuss.             Tiffany A Decker at 08/21/2013  4:18 PM      Status: Signed            Patient saying she never got a call about nexplanon.

## 2013-09-22 NOTE — Telephone Encounter (Signed)
Spoke with patient. Patient states that she started taking her sugar pills in her OCP yesterday. Patient would like to schedule nexplanon insertion. Patient states that she has not yet started her cycle. States that she was told by Verner Chol CNM that she can schedule within the first five days of being on the sugar pill. Advised nexplanon has to be inserted with first five days of cycle. Patient is agreeable. "She told me since I was on OCP it could be scheduled in the first five days of starting the sugar pills." Patient had consult for nexplanon insertion with Dr.Silva on 07/07/13. Appointment scheduled for Wednesday at 2:45pm with Dr.Silva. Advised patient will have to send message to Dr.Silva regarding scheduling and give patient a call back as may need pregnancy test prior to insertion. Patient is agreeable.

## 2013-09-22 NOTE — Telephone Encounter (Signed)
Really it is best to insert the Nexplanon while she is on her cycle, during the first five days as we usually do.  I am expecting that this will be during her placebo pills.  Have her continue her birth control pills and not skip any and call us back when she is bleeding.  Thanks for checking with me.

## 2013-09-22 NOTE — Telephone Encounter (Signed)
Spoke with patient. Advised patient of message as seen below from Dr.Silva. Patient is agreeable and verbalizes understanding. Appointment for Wednesday cancelled. Patient will call back with first day of cycle to schedule.  Routing to provider for final review. Patient agreeable to disposition. Will close encounter

## 2013-09-24 ENCOUNTER — Ambulatory Visit: Payer: BC Managed Care – PPO | Admitting: Obstetrics and Gynecology

## 2013-10-21 ENCOUNTER — Telehealth: Payer: Self-pay | Admitting: Certified Nurse Midwife

## 2013-10-21 DIAGNOSIS — Z30017 Encounter for initial prescription of implantable subdermal contraceptive: Secondary | ICD-10-CM

## 2013-10-21 NOTE — Telephone Encounter (Signed)
Pt calling to schedule an appointment for a implant in her arm. She states she started her cycle today.

## 2013-10-21 NOTE — Telephone Encounter (Signed)
Spoke with patient. Patient started cycle today and would like to schedule appointment for nexplanon insertion. Patient had consult with Dr.Silva on 7/6 regarding nexplanon insertion. Appointment scheduled for tomorrow at 3pm with Dr.Silva. Patient agreeable to date and time.  Cc: Cathrine MusterSabrina Franklin Order for nexplanon insertion will need precert. Order entered.   Routing to provider for final review. Patient agreeable to disposition. Will close encounter

## 2013-10-22 ENCOUNTER — Encounter: Payer: Self-pay | Admitting: Obstetrics and Gynecology

## 2013-10-22 ENCOUNTER — Ambulatory Visit (INDEPENDENT_AMBULATORY_CARE_PROVIDER_SITE_OTHER): Payer: BC Managed Care – PPO | Admitting: Obstetrics and Gynecology

## 2013-10-22 VITALS — BP 102/68 | HR 68 | Resp 18 | Wt 163.0 lb

## 2013-10-22 DIAGNOSIS — Z30017 Encounter for initial prescription of implantable subdermal contraceptive: Secondary | ICD-10-CM

## 2013-10-22 DIAGNOSIS — Z308 Encounter for other contraceptive management: Secondary | ICD-10-CM

## 2013-10-22 NOTE — Patient Instructions (Signed)
Etonogestrel implant What is this medicine? ETONOGESTREL (et oh noe JES trel) is a contraceptive (birth control) device. It is used to prevent pregnancy. It can be used for up to 3 years. This medicine may be used for other purposes; ask your health care provider or pharmacist if you have questions. COMMON BRAND NAME(S): Implanon, Nexplanon What should I tell my health care provider before I take this medicine? They need to know if you have any of these conditions: -abnormal vaginal bleeding -blood vessel disease or blood clots -cancer of the breast, cervix, or liver -depression -diabetes -gallbladder disease -headaches -heart disease or recent heart attack -high blood pressure -high cholesterol -kidney disease -liver disease -renal disease -seizures -tobacco smoker -an unusual or allergic reaction to etonogestrel, other hormones, anesthetics or antiseptics, medicines, foods, dyes, or preservatives -pregnant or trying to get pregnant -breast-feeding How should I use this medicine? This device is inserted just under the skin on the inner side of your upper arm by a health care professional. Talk to your pediatrician regarding the use of this medicine in children. Special care may be needed. Overdosage: If you think you've taken too much of this medicine contact a poison control center or emergency room at once. Overdosage: If you think you have taken too much of this medicine contact a poison control center or emergency room at once. NOTE: This medicine is only for you. Do not share this medicine with others. What if I miss a dose? This does not apply. What may interact with this medicine? Do not take this medicine with any of the following medications: -amprenavir -bosentan -fosamprenavir This medicine may also interact with the following medications: -barbiturate medicines for inducing sleep or treating seizures -certain medicines for fungal infections like ketoconazole and  itraconazole -griseofulvin -medicines to treat seizures like carbamazepine, felbamate, oxcarbazepine, phenytoin, topiramate -modafinil -phenylbutazone -rifampin -some medicines to treat HIV infection like atazanavir, indinavir, lopinavir, nelfinavir, tipranavir, ritonavir -St. John's wort This list may not describe all possible interactions. Give your health care provider a list of all the medicines, herbs, non-prescription drugs, or dietary supplements you use. Also tell them if you smoke, drink alcohol, or use illegal drugs. Some items may interact with your medicine. What should I watch for while using this medicine? This product does not protect you against HIV infection (AIDS) or other sexually transmitted diseases. You should be able to feel the implant by pressing your fingertips over the skin where it was inserted. Tell your doctor if you cannot feel the implant. What side effects may I notice from receiving this medicine? Side effects that you should report to your doctor or health care professional as soon as possible: -allergic reactions like skin rash, itching or hives, swelling of the face, lips, or tongue -breast lumps -changes in vision -confusion, trouble speaking or understanding -dark urine -depressed mood -general ill feeling or flu-like symptoms -light-colored stools -loss of appetite, nausea -right upper belly pain -severe headaches -severe pain, swelling, or tenderness in the abdomen -shortness of breath, chest pain, swelling in a leg -signs of pregnancy -sudden numbness or weakness of the face, arm or leg -trouble walking, dizziness, loss of balance or coordination -unusual vaginal bleeding, discharge -unusually weak or tired -yellowing of the eyes or skin Side effects that usually do not require medical attention (Report these to your doctor or health care professional if they continue or are bothersome.): -acne -breast pain -changes in  weight -cough -fever or chills -headache -irregular menstrual bleeding -itching, burning, and   vaginal discharge -pain or difficulty passing urine -sore throat This list may not describe all possible side effects. Call your doctor for medical advice about side effects. You may report side effects to FDA at 1-800-FDA-1088. Where should I keep my medicine? This drug is given in a hospital or clinic and will not be stored at home. NOTE: This sheet is a summary. It may not cover all possible information. If you have questions about this medicine, talk to your doctor, pharmacist, or health care provider.  2015, [employer]vier/Gold Standard. (2011-06-26 15:37:45)  

## 2013-10-22 NOTE — Progress Notes (Signed)
GYNECOLOGY  VISIT   HPI: 23 y.o.   Single  Caucasian  female   G0P0000 with Patient's last menstrual period was 10/21/2013.   here for  Nexplanon Insertion  Patient currently on OCPs.   GYNECOLOGIC HISTORY: Patient's last menstrual period was 10/21/2013. Contraception: OCP  Menopausal hormone therapy: None        OB History   Grav Para Term Preterm Abortions TAB SAB Ect Mult Living   0 0 0 0 0 0 0 0 0 0          There are no active problems to display for this patient.   Past Medical History  Diagnosis Date  . Inverse psoriasis   . IBS (irritable bowel syndrome)   . Allergy   . Depression   . Anxiety     History reviewed. No pertinent past surgical history.  Current Outpatient Prescriptions  Medication Sig Dispense Refill  . Acetaminophen (TYLENOL PO) Take by mouth as needed.      Marland Kitchen. aspirin-acetaminophen-caffeine (EXCEDRIN MIGRAINE) 250-250-65 MG per tablet Take 1 tablet by mouth every 8 (eight) hours as needed.       . Betamethasone Valerate 0.12 % foam daily.      Marland Kitchen. FLUoxetine HCl (PROZAC PO) Take 30 mg by mouth daily.       . Ibuprofen (ADVIL PO) Take by mouth as needed.      . lamoTRIgine (LAMICTAL) 100 MG tablet Take 100 mg by mouth daily.      . methylphenidate (RITALIN) 20 MG tablet Take 20 mg by mouth as needed.       . norethindrone-ethinyl estradiol (JUNEL FE 1/20) 1-20 MG-MCG tablet TAKE 1 TABLET BY MOUTH EVERY DAY  1 Package  12   No current facility-administered medications for this visit.     ALLERGIES: Wheat bran  Family History  Problem Relation Age of Onset  . Cancer Father     skin  . Hypertension Father   . Cancer Maternal Grandmother     liver  . Hypertension Paternal Grandmother   . Asthma Brother     History   Social History  . Marital Status: Single    Spouse Name: N/A    Number of Children: N/A  . Years of Education: N/A   Occupational History  . Not on file.   Social History Main Topics  . Smoking status: Never Smoker    . Smokeless tobacco: Not on file  . Alcohol Use: 2.0 - 2.5 oz/week    4-5 drink(s) per week  . Drug Use: No  . Sexual Activity: Yes    Partners: Male    Birth Control/ Protection: Pill     Comment: Junel Fe 1/20   Other Topics Concern  . Not on file   Social History Narrative  . No narrative on file    ROS:  Pertinent items are noted in HPI.  PHYSICAL EXAMINATION:    BP 102/68  Pulse 68  Resp 18  Wt 163 lb (73.936 kg)  LMP 10/21/2013     General appearance: alert, cooperative and appears stated age   Procedure Nexplanon insertion - lot number [id number], Expiration 12/17 Insertion in left arm.  Landmarks noted. Sterile prep of left arm with betadine and then rubbing alcohol. Local 1% lidocaine 3 cc lot number [id number], Expiration 06/03/14 Nexplanon placed without difficulty.  Sterile bandaid and then sterile Kerlex placed.  Tolerated well.  No complications.   ASSESSMENT  Nexplanon insertion.   PLAN  Instructions and precautions  given.  OK to stop OCPs.  Use back up protection for first month.  Return in about 4 weeks for check.    An After Visit Summary was printed and given to the patient.  _15_____ minutes face to face time of which over 50% was spent in counseling.

## 2013-11-19 ENCOUNTER — Ambulatory Visit (INDEPENDENT_AMBULATORY_CARE_PROVIDER_SITE_OTHER): Payer: BC Managed Care – PPO | Admitting: Obstetrics and Gynecology

## 2013-11-19 ENCOUNTER — Encounter: Payer: Self-pay | Admitting: Obstetrics and Gynecology

## 2013-11-19 VITALS — BP 100/60 | HR 80 | Ht 68.25 in | Wt 162.8 lb

## 2013-11-19 DIAGNOSIS — Z3049 Encounter for surveillance of other contraceptives: Secondary | ICD-10-CM

## 2013-11-19 NOTE — Progress Notes (Signed)
Patient ID: Melanie Oneill, female   DOB: 1990/01/16, 23 y.o.   MRN: [id number] GYNECOLOGY  VISIT   HPI: 23 y.o.   Single  Caucasian  female   G0P0000 with Patient's last menstrual period was 10/21/2013.   here for 4 week follow up post Nexplanon insertion.   No menses since Nexplanon insertion.  No arm discomfort.  Able to palpable the Nexplanon.   GYNECOLOGIC HISTORY: Patient's last menstrual period was 10/21/2013. Contraception:  Nexplanon--inserted 10-22-13.  Menopausal hormone therapy: n/a        OB History    Gravida Para Term Preterm AB TAB SAB Ectopic Multiple Living   0 0 0 0 0 0 0 0 0 0          There are no active problems to display for this patient.   Past Medical History  Diagnosis Date  . Inverse psoriasis   . IBS (irritable bowel syndrome)   . Allergy   . Depression   . Anxiety     History reviewed. No pertinent past surgical history.  Current Outpatient Prescriptions  Medication Sig Dispense Refill  . etonogestrel (NEXPLANON) 68 MG IMPL implant 1 each by Subdermal route once.    Marland Kitchen. aspirin-acetaminophen-caffeine (EXCEDRIN MIGRAINE) 250-250-65 MG per tablet Take 1 tablet by mouth every 8 (eight) hours as needed.     . Betamethasone Valerate 0.12 % foam daily.    Marland Kitchen. FLUoxetine HCl (PROZAC PO) Take 30 mg by mouth daily.     Marland Kitchen. lamoTRIgine (LAMICTAL) 100 MG tablet Take 100 mg by mouth daily.    . methylphenidate (RITALIN) 20 MG tablet Take 20 mg by mouth as needed.      No current facility-administered medications for this visit.     ALLERGIES: Wheat bran  Family History  Problem Relation Age of Onset  . Cancer Father     skin  . Hypertension Father   . Cancer Maternal Grandmother     liver  . Hypertension Paternal Grandmother   . Asthma Brother     History   Social History  . Marital Status: Single    Spouse Name: N/A    Number of Children: N/A  . Years of Education: N/A   Occupational History  . Not on file.   Social History Main Topics   . Smoking status: Never Smoker   . Smokeless tobacco: Not on file  . Alcohol Use: 2.0 - 2.5 oz/week    4-5 drink(s) per week  . Drug Use: No  . Sexual Activity:    Partners: Male    Birth Control/ Protection: Pill     Comment: Junel Fe 1/20   Other Topics Concern  . Not on file   Social History Narrative    ROS:  Pertinent items are noted in HPI.  PHYSICAL EXAMINATION:    BP 100/60 mmHg  Pulse 80  Ht 5' 8.25" (1.734 m)  Wt 162 lb 12.8 oz (73.846 kg)  BMI 24.56 kg/m2  LMP 10/21/2013     General appearance: alert, cooperative and appears stated age Left arm - Nexplanon palpable and intact in left arm.    ASSESSMENT  Nexplanon patient - doing well.  Amenorrheic.   PLAN  Instructed that the Nexplanon is working for contraception.  I would still recommend condoms for STD prevention.  Reviewed that patient may develop irregular menses.  Follow up for annual exam in June 2016 with Sara Chuebbie Leonard.   An After Visit Summary was printed and given to the patient.  __10____ minutes face to face time of which over 50% was spent in counseling.

## 2014-04-06 ENCOUNTER — Encounter: Payer: Self-pay | Admitting: Nurse Practitioner

## 2014-04-06 ENCOUNTER — Ambulatory Visit (INDEPENDENT_AMBULATORY_CARE_PROVIDER_SITE_OTHER): Payer: BLUE CROSS/BLUE SHIELD | Admitting: Nurse Practitioner

## 2014-04-06 VITALS — BP 104/66 | HR 68 | Temp 98.7°F | Ht 68.25 in | Wt 159.0 lb

## 2014-04-06 DIAGNOSIS — B3731 Acute candidiasis of vulva and vagina: Secondary | ICD-10-CM

## 2014-04-06 DIAGNOSIS — B373 Candidiasis of vulva and vagina: Secondary | ICD-10-CM | POA: Diagnosis not present

## 2014-04-06 MED ORDER — FLUCONAZOLE 150 MG PO TABS: 150.0000 mg | ORAL_TABLET | Freq: Once | ORAL | Status: DC

## 2014-04-06 MED ORDER — NYSTATIN-TRIAMCINOLONE 100000-0.1 UNIT/GM-% EX OINT: 1.0000 "application " | TOPICAL_OINTMENT | Freq: Two times a day (BID) | CUTANEOUS | Status: DC

## 2014-04-06 NOTE — Patient Instructions (Signed)

## 2014-04-06 NOTE — Progress Notes (Signed)
24 y.o.Single white female G0,  here with complaint of vaginal symptoms of itching, burning, and increase discharge. Describes discharge as white and yellow. Onset of symptoms 1 month  ago. Denies new personal products. No  STD concerns  same partner for 11 months. Urinary symptoms no . Contraception is Nexplanon 10/22/13.  LMP at same time of insertion.  Got spotting this past week of blood tinged X 2 days. Using hydrocortisone cream 0.5 % for psoriasis on the vulva for a flare daily X 2 weeks and has not helped.Marland Kitchen.   O:Healthy female WDWN Affect: normal, orientation x 3  Exam: no acute distress Abdomen: soft and non tender Lymph node: no enlargement or tenderness Pelvic exam: External genital: normal female with a lot of redness with scaly lesions all around the areas BUS: negative Vagina: white discharge noted. Affirm taken Cervix: normal, non tender, no CMT Uterus: normal, non tender Adnexa:normal, non tender, no masses or fullness noted      A: Normal pelvic exam  History of vulvar psoriasis  Most likely yeast vaginitis  Nexplanon for birth control   P: Discussed findings of possible yeast and etiology. Discussed Aveeno or baking soda sitz bath for comfort. Avoid moist clothes or pads for extended period of time. If working out in gym clothes or swim suits for long periods of time change underwear or bottoms of swimsuit if possible. Olive Oil/Coconut Oil use for skin protection prior to activity can be used to external skin.  Rx: will go ahead and start on Diflucan and Triamcinolone for comfort  Follow with Affirm test results  RV prn

## 2014-04-07 ENCOUNTER — Other Ambulatory Visit: Payer: Self-pay | Admitting: Nurse Practitioner

## 2014-04-07 LAB — WET PREP BY MOLECULAR PROBE
CANDIDA SPECIES: NEGATIVE
GARDNERELLA VAGINALIS: POSITIVE — AB
Trichomonas vaginosis: NEGATIVE

## 2014-04-07 MED ORDER — METRONIDAZOLE 0.75 % VA GEL: 1.0000 | Freq: Every day | VAGINAL | Status: DC

## 2014-04-09 NOTE — Progress Notes (Signed)
Encounter reviewed by Dr. Brook Silva.  

## 2014-05-29 ENCOUNTER — Ambulatory Visit (INDEPENDENT_AMBULATORY_CARE_PROVIDER_SITE_OTHER): Payer: BLUE CROSS/BLUE SHIELD | Admitting: Emergency Medicine

## 2014-05-29 VITALS — BP 100/68 | HR 80 | Temp 98.5°F | Resp 16 | Ht 68.25 in | Wt 152.1 lb

## 2014-05-29 DIAGNOSIS — J0140 Acute pansinusitis, unspecified: Secondary | ICD-10-CM | POA: Diagnosis not present

## 2014-05-29 MED ORDER — AMOXICILLIN-POT CLAVULANATE 875-125 MG PO TABS: 1.0000 | ORAL_TABLET | Freq: Two times a day (BID) | ORAL | Status: DC

## 2014-05-29 NOTE — Patient Instructions (Signed)

## 2014-05-29 NOTE — Progress Notes (Signed)
Subjective:  Patient ID: Melanie Oneill, female    DOB: 1990/11/27  Age: 24 y.o. MRN: [id number]  CC: Sinusitis   HPI Melanie Oneill presents for  Evaluation of her presumed sinusitis. She has purulent nasal drainage and postnasal drip. She has pressure in both cheeks. Left ear. She denies any cough fever or chills no wheezing or shortness of breath. She has no nausea or vomiting. She has no stool change. There is no rash.   She's had no improvement of symptoms with over-the-counter medication.  Outpatient Prescriptions Prior to Visit  Medication Sig Dispense Refill  . etonogestrel (NEXPLANON) 68 MG IMPL implant 1 each by Subdermal route once.    Marland Kitchen. FLUoxetine (PROZAC) 10 MG capsule Take 10 mg by mouth daily. Take with 20 mg caplet.  12  . FLUoxetine (PROZAC) 20 MG capsule Take 20 mg by mouth daily. Take with 10 mg caplet.  99  . lamoTRIgine (LAMICTAL) 100 MG tablet Take 100 mg by mouth daily.    Marland Kitchen. aspirin-acetaminophen-caffeine (EXCEDRIN MIGRAINE) 250-250-65 MG per tablet Take 1 tablet by mouth every 8 (eight) hours as needed.     . Betamethasone Valerate 0.12 % foam daily.    . fluconazole (DIFLUCAN) 150 MG tablet Take 1 tablet (150 mg total) by mouth once. Take one tablet.  Repeat in 48 hours if symptoms are not completely resolved. (Patient not taking: Reported on 05/29/2014) 2 tablet 0  . methylphenidate (RITALIN) 20 MG tablet Take 20 mg by mouth as needed.     . metroNIDAZOLE (METROGEL) 0.75 % vaginal gel Place 1 Applicatorful vaginally at bedtime. (Patient not taking: Reported on 05/29/2014) 70 g 0  . nystatin-triamcinolone ointment (MYCOLOG) Apply 1 application topically 2 (two) times daily. (Patient not taking: Reported on 05/29/2014) 30 g 0   No facility-administered medications prior to visit.    History   Social History  . Marital Status: Single    Spouse Name: N/A  . Number of Children: N/A  . Years of Education: N/A   Social History Main Topics  . Smoking status: Never  Smoker   . Smokeless tobacco: Not on file  . Alcohol Use: 2.4 - 3.0 oz/week    4-5 Standard drinks or equivalent per week  . Drug Use: No  . Sexual Activity:    Partners: Male    Birth Control/ Protection: Pill     Comment: Junel Fe 1/20   Other Topics Concern  . None   Social History Narrative    Family History  Problem Relation Age of Onset  . Cancer Father     skin  . Hypertension Father   . Cancer Maternal Grandmother     liver  . Hypertension Paternal Grandmother   . Asthma Brother     Past Medical History  Diagnosis Date  . Inverse psoriasis   . IBS (irritable bowel syndrome)   . Allergy   . Depression   . Anxiety      Review of Systems  Constitutional: Positive for chills. Negative for fever and appetite change.  HENT: Positive for postnasal drip, rhinorrhea and sore throat. Negative for congestion, ear pain and sinus pressure.   Eyes: Negative for pain and redness.  Respiratory: Negative for cough, shortness of breath and wheezing.   Cardiovascular: Negative for leg swelling.  Gastrointestinal: Negative for nausea, vomiting, abdominal pain, diarrhea, constipation and blood in stool.  Endocrine: Negative for polyuria.  Genitourinary: Negative for dysuria, urgency, frequency and flank pain.  Musculoskeletal: Negative for gait  problem.  Skin: Negative for rash.  Neurological: Negative for weakness and headaches.  Psychiatric/Behavioral: Negative for confusion and decreased concentration. The patient is not nervous/anxious.     Objective:  BP 100/68 mmHg  Pulse 80  Temp(Src) 98.5 F (36.9 C) (Oral)  Resp 16  Ht 5' 8.25" (1.734 m)  Wt 152 lb 2 oz (69.003 kg)  BMI 22.95 kg/m2  SpO2 98%  BP Readings from Last 3 Encounters:  05/29/14 100/68  04/06/14 104/66  11/19/13 100/60    Wt Readings from Last 3 Encounters:  05/29/14 152 lb 2 oz (69.003 kg)  04/06/14 159 lb (72.122 kg)  11/19/13 162 lb 12.8 oz (73.846 kg)    Physical Exam    Constitutional: She is oriented to person, place, and time. She appears well-developed and well-nourished.  HENT:  Head: Normocephalic and atraumatic.  Nose: Rhinorrhea present.  Eyes: Conjunctivae are normal. Pupils are equal, round, and reactive to light.  Neck: Normal range of motion. Neck supple.  Pulmonary/Chest: Effort normal.  Musculoskeletal: She exhibits no edema.  Neurological: She is alert and oriented to person, place, and time.  Skin: Skin is dry.  Psychiatric: She has a normal mood and affect. Her behavior is normal. Thought content normal.    No results found for: WBC, HGB, HCT, PLT, GLUCOSE, CHOL, TRIG, HDL, LDLDIRECT, LDLCALC, ALT, AST, NA, K, CL, CREATININE, BUN, CO2, TSH, PSA, INR, GLUF, HGBA1C, MICROALBUR    .  Assessment & Plan:   Aireonna was seen today for sinusitis.  Diagnoses and all orders for this visit:  Acute pansinusitis, recurrence not specified  Other orders -     amoxicillin-clavulanate (AUGMENTIN) 875-125 MG per tablet; Take 1 tablet by mouth 2 (two) times daily.   I am having Ms. Oneill start on amoxicillin-clavulanate. I am also having her maintain her lamoTRIgine, Betamethasone Valerate, aspirin-acetaminophen-caffeine, methylphenidate, etonogestrel, FLUoxetine, FLUoxetine, fluconazole, nystatin-triamcinolone ointment, and metroNIDAZOLE.  Meds ordered this encounter  Medications  . amoxicillin-clavulanate (AUGMENTIN) 875-125 MG per tablet    Sig: Take 1 tablet by mouth 2 (two) times daily.    Dispense:  20 tablet    Refill:  0    she'll continue using her Mucinex D at home and follow-up after completion of her antibiotic as needed  Follow-up: Return if symptoms worsen or fail to improve.  Carmelina Dane, MD

## 2014-07-01 ENCOUNTER — Ambulatory Visit (INDEPENDENT_AMBULATORY_CARE_PROVIDER_SITE_OTHER): Payer: BLUE CROSS/BLUE SHIELD | Admitting: Certified Nurse Midwife

## 2014-07-01 ENCOUNTER — Encounter: Payer: Self-pay | Admitting: Certified Nurse Midwife

## 2014-07-01 VITALS — BP 104/68 | HR 68 | Resp 16 | Ht 67.75 in | Wt 157.0 lb

## 2014-07-01 DIAGNOSIS — Z01419 Encounter for gynecological examination (general) (routine) without abnormal findings: Secondary | ICD-10-CM | POA: Diagnosis not present

## 2014-07-01 NOTE — Patient Instructions (Signed)
General topics  Next pap or exam is  due in 1 year Take a Women's multivitamin Take 1200 mg. of calcium daily - prefer dietary If any concerns in interim to call back  Breast Self-Awareness Practicing breast self-awareness may pick up problems early, prevent significant medical complications, and possibly save your life. By practicing breast self-awareness, you can become familiar with how your breasts look and feel and if your breasts are changing. This allows you to notice changes early. It can also offer you some reassurance that your breast health is good. One way to learn what is normal for your breasts and whether your breasts are changing is to do a breast self-exam. If you find a lump or something that was not present in the past, it is best to contact your caregiver right away. Other findings that should be evaluated by your caregiver include nipple discharge, especially if it is bloody; skin changes or reddening; areas where the skin seems to be pulled in (retracted); or new lumps and bumps. Breast pain is seldom associated with cancer (malignancy), but should also be evaluated by a caregiver. BREAST SELF-EXAM The best time to examine your breasts is 5 7 days after your menstrual period is over.  ExitCare Patient Information 2013 [employer].   Exercise to Stay Healthy Exercise helps you become and stay healthy. EXERCISE IDEAS AND TIPS Choose exercises that:  You enjoy.  Fit into your day. You do not need to exercise really hard to be healthy. You can do exercises at a slow or medium level and stay healthy. You can:  Stretch before and after working out.  Try yoga, Pilates, or tai chi.  Lift weights.  Walk fast, swim, jog, run, climb stairs, bicycle, dance, or rollerskate.  Take aerobic classes. Exercises that burn about 150 calories:  Running 1  miles in 15 minutes.  Playing volleyball for 45 to 60 minutes.  Washing and waxing a car for 45 to 60  minutes.  Playing touch football for 45 minutes.  Walking 1  miles in 35 minutes.  Pushing a stroller 1  miles in 30 minutes.  Playing basketball for 30 minutes.  Raking leaves for 30 minutes.  Bicycling 5 miles in 30 minutes.  Walking 2 miles in 30 minutes.  Dancing for 30 minutes.  Shoveling snow for 15 minutes.  Swimming laps for 20 minutes.  Walking up stairs for 15 minutes.  Bicycling 4 miles in 15 minutes.  Gardening for 30 to 45 minutes.  Jumping rope for 15 minutes.  Washing windows or floors for 45 to 60 minutes. Document Released: 01/21/2010 Document Revised: 03/13/2011 Document Reviewed: 01/21/2010 ExitCare Patient Information 2013 [employer].   Other topics ( that may be useful information):    Sexually Transmitted Disease Sexually transmitted disease (STD) refers to any infection that is passed from person to person during sexual activity. This may happen by way of saliva, semen, blood, vaginal mucus, or urine. Common STDs include:  Gonorrhea.  Chlamydia.  Syphilis.  HIV/AIDS.  Genital herpes.  Hepatitis B and C.  Trichomonas.  Human papillomavirus (HPV).  Pubic lice. CAUSES  An STD may be spread by bacteria, virus, or parasite. A person can get an STD by:  Sexual intercourse with an infected person.  Sharing sex toys with an infected person.  Sharing needles with an infected person.  Having intimate contact with the genitals, mouth, or rectal areas of an infected person. SYMPTOMS  Some people may not have any symptoms, but   they can still pass the infection to others. Different STDs have different symptoms. Symptoms include:  Painful or bloody urination.  Pain in the pelvis, abdomen, vagina, anus, throat, or eyes.  Skin rash, itching, irritation, growths, or sores (lesions). These usually occur in the genital or anal area.  Abnormal vaginal discharge.  Penile discharge in men.  Soft, flesh-colored skin growths in the  genital or anal area.  Fever.  Pain or bleeding during sexual intercourse.  Swollen glands in the groin area.  Yellow skin and eyes (jaundice). This is seen with hepatitis. DIAGNOSIS  To make a diagnosis, your caregiver may:  Take a medical history.  Perform a physical exam.  Take a specimen (culture) to be examined.  Examine a sample of discharge under a microscope.  Perform blood test TREATMENT   Chlamydia, gonorrhea, trichomonas, and syphilis can be cured with antibiotic medicine.  Genital herpes, hepatitis, and HIV can be treated, but not cured, with prescribed medicines. The medicines will lessen the symptoms.  Genital warts from HPV can be treated with medicine or by freezing, burning (electrocautery), or surgery. Warts may come back.  HPV is a virus and cannot be cured with medicine or surgery.However, abnormal areas may be followed very closely by your caregiver and may be removed from the cervix, vagina, or vulva through office procedures or surgery. If your diagnosis is confirmed, your recent sexual partners need treatment. This is true even if they are symptom-free or have a negative culture or evaluation. They should not have sex until their caregiver says it is okay. HOME CARE INSTRUCTIONS  All sexual partners should be informed, tested, and treated for all STDs.  Take your antibiotics as directed. Finish them even if you start to feel better.  Only take over-the-counter or prescription medicines for pain, discomfort, or fever as directed by your caregiver.  Rest.  Eat a balanced diet and drink enough fluids to keep your urine clear or pale yellow.  Do not have sex until treatment is completed and you have followed up with your caregiver. STDs should be checked after treatment.  Keep all follow-up appointments, Pap tests, and blood tests as directed by your caregiver.  Only use latex condoms and water-soluble lubricants during sexual activity. Do not use  petroleum jelly or oils.  Avoid alcohol and illegal drugs.  Get vaccinated for HPV and hepatitis. If you have not received these vaccines in the past, talk to your caregiver about whether one or both might be right for you.  Avoid risky sex practices that can break the skin. The only way to avoid getting an STD is to avoid all sexual activity.Latex condoms and dental dams (for oral sex) will help lessen the risk of getting an STD, but will not completely eliminate the risk. SEEK MEDICAL CARE IF:   You have a fever.  You have any new or worsening symptoms. Document Released: 03/11/2002 Document Revised: 03/13/2011 Document Reviewed: 03/18/2010 [facility] Patient Information 2013 [employer].    Domestic Abuse You are being battered or abused if someone close to you hits, pushes, or physically hurts you in any way. You also are being abused if you are forced into activities. You are being sexually abused if you are forced to have sexual contact of any kind. You are being emotionally abused if you are made to feel worthless or if you are constantly threatened. It is important to remember that help is available. No one has the right to abuse you. PREVENTION OF FURTHER  ABUSE  Learn the warning signs of danger. This varies with situations but may include: the use of alcohol, threats, isolation from friends and family, or forced sexual contact. Leave if you feel that violence is going to occur.  If you are attacked or beaten, report it to the police so the abuse is documented. You do not have to press charges. The police can protect you while you or the attackers are leaving. Get the officer's name and badge number and a copy of the report.  Find someone you can trust and tell them what is happening to you: your caregiver, a nurse, clergy member, close friend or family member. Feeling ashamed is natural, but remember that you have done nothing wrong. No one deserves abuse. Document Released:  12/17/1999 Document Revised: 03/13/2011 Document Reviewed: 02/24/2010 ExitCare Patient Information 2013 [employer].    How Much is Too Much Alcohol? Drinking too much alcohol can cause injury, accidents, and health problems. These types of problems can include:   Car crashes.  Falls.  Family fighting (domestic violence).  Drowning.  Fights.  Injuries.  Burns.  Damage to certain organs.  Having a baby with birth defects. ONE DRINK CAN BE TOO MUCH WHEN YOU ARE:  Working.  Pregnant or breastfeeding.  Taking medicines. Ask your doctor.  Driving or planning to drive. If you or someone you know has a drinking problem, get help from a doctor.  Document Released: 10/15/2008 Document Revised: 03/13/2011 Document Reviewed: 10/15/2008 ExitCare Patient Information 2013 [employer].   Smoking Hazards Smoking cigarettes is extremely bad for your health. Tobacco smoke has over 200 known poisons in it. There are over 60 chemicals in tobacco smoke that cause cancer. Some of the chemicals found in cigarette smoke include:   Cyanide.  Benzene.  Formaldehyde.  Methanol (wood alcohol).  Acetylene (fuel used in welding torches).  Ammonia. Cigarette smoke also contains the poisonous gases nitrogen oxide and carbon monoxide.  Cigarette smokers have an increased risk of many serious medical problems and Smoking causes approximately:  90% of all lung cancer deaths in men.  80% of all lung cancer deaths in women.  90% of deaths from chronic obstructive lung disease. Compared with nonsmokers, smoking increases the risk of:  Coronary heart disease by 2 to 4 times.  Stroke by 2 to 4 times.  Men developing lung cancer by 23 times.  Women developing lung cancer by 13 times.  Dying from chronic obstructive lung diseases by 12 times.  . Smoking is the most preventable cause of death and disease in our society.  WHY IS SMOKING ADDICTIVE?  Nicotine is the chemical  agent in tobacco that is capable of causing addiction or dependence.  When you smoke and inhale, nicotine is absorbed rapidly into the bloodstream through your lungs. Nicotine absorbed through the lungs is capable of creating a powerful addiction. Both inhaled and non-inhaled nicotine may be addictive.  Addiction studies of cigarettes and spit tobacco show that addiction to nicotine occurs mainly during the teen years, when young people begin using tobacco products. WHAT ARE THE BENEFITS OF QUITTING?  There are many health benefits to quitting smoking.   Likelihood of developing cancer and heart disease decreases. Health improvements are seen almost immediately.  Blood pressure, pulse rate, and breathing patterns start returning to normal soon after quitting. QUITTING SMOKING   American Lung Association - 1-800-LUNGUSA  American Cancer Society - 1-800-ACS-2345 Document Released: 01/27/2004 Document Revised: 03/13/2011 Document Reviewed: 09/30/2008 ExitCare Patient Information 2013 ExitCare,   LLC.   Stress Management Stress is a state of physical or mental tension that often results from changes in your life or normal routine. Some common causes of stress are:  Death of a loved one.  Injuries or severe illnesses.  Getting fired or changing jobs.  Moving into a new home. Other causes may be:  Sexual problems.  Business or financial losses.  Taking on a large debt.  Regular conflict with someone at home or at work.  Constant tiredness from lack of sleep. It is not just bad things that are stressful. It may be stressful to:  Win the lottery.  Get married.  Buy a new car. The amount of stress that can be easily tolerated varies from person to person. Changes generally cause stress, regardless of the types of change. Too much stress can affect your health. It may lead to physical or emotional problems. Too little stress (boredom) may also become stressful. SUGGESTIONS TO  REDUCE STRESS:  Talk things over with your family and friends. It often is helpful to share your concerns and worries. If you feel your problem is serious, you may want to get help from a professional counselor.  Consider your problems one at a time instead of lumping them all together. Trying to take care of everything at once may seem impossible. List all the things you need to do and then start with the most important one. Set a goal to accomplish 2 or 3 things each day. If you expect to do too many in a single day you will naturally fail, causing you to feel even more stressed.  Do not use alcohol or drugs to relieve stress. Although you may feel better for a short time, they do not remove the problems that caused the stress. They can also be habit forming.  Exercise regularly - at least 3 times per week. Physical exercise can help to relieve that "uptight" feeling and will relax you.  The shortest distance between despair and hope is often a good night's sleep.  Go to bed and get up on time allowing yourself time for appointments without being rushed.  Take a short "time-out" period from any stressful situation that occurs during the day. Close your eyes and take some deep breaths. Starting with the muscles in your face, tense them, hold it for a few seconds, then relax. Repeat this with the muscles in your neck, shoulders, hand, stomach, back and legs.  Take good care of yourself. Eat a balanced diet and get plenty of rest.  Schedule time for having fun. Take a break from your daily routine to relax. HOME CARE INSTRUCTIONS   Call if you feel overwhelmed by your problems and feel you can no longer manage them on your own.  Return immediately if you feel like hurting yourself or someone else. Document Released: 06/14/2000 Document Revised: 03/13/2011 Document Reviewed: 02/04/2007 ExitCare Patient Information 2013 ExitCare[employer].   

## 2014-07-01 NOTE — Progress Notes (Signed)
24 y.o. G0P0000 Single  Caucasian Fe here for annual exam. No periods with Nexplanon.. No partner change, no STD screening needed. Recently sprained right wrist with fall under orthopedic management.  Sees PCP prn. No other health issues today. [place]  No LMP recorded. Patient has had an implant.          Sexually active: Yes.    The current method of family planning is nexplanon.    Exercising: No.  exercise Smoker:  no  Health Mainttenance: Pap: 06-25-12 neg MMG:  none Colonoscopy:  none BMD:   none TDaP:  2010 Labs: none Self breast exam: done monthly   reports that she has never smoked. She does not have any smokeless tobacco history on file. She reports that she drinks about 1.8 oz of alcohol per week. She reports that she does not use illicit drugs.  Past Medical History  Diagnosis Date  . Inverse psoriasis   . IBS (irritable bowel syndrome)   . Allergy   . Depression   . Anxiety     History reviewed. No pertinent past surgical history.  Current Outpatient Prescriptions  Medication Sig Dispense Refill  . Betamethasone Valerate 0.12 % foam daily.    Marland Kitchen. etonogestrel (NEXPLANON) 68 MG IMPL implant 1 each by Subdermal route once.    Marland Kitchen. FLUoxetine (PROZAC) 10 MG capsule Take 10 mg by mouth daily. Take with 20 mg caplet.  12  . FLUoxetine (PROZAC) 20 MG capsule Take 20 mg by mouth daily. Take with 10 mg caplet.  99  . lamoTRIgine (LAMICTAL) 100 MG tablet Take 100 mg by mouth daily.     No current facility-administered medications for this visit.    Family History  Problem Relation Age of Onset  . Cancer Father     skin  . Hypertension Father   . Cancer Maternal Grandmother     liver  . Hypertension Paternal Grandmother   . Asthma Brother     ROS:  Pertinent items are noted in HPI.  Otherwise, a comprehensive ROS was negative.  Exam:   BP 104/68 mmHg  Pulse 68  Resp 16  Ht 5' 7.75" (1.721 m)  Wt 157 lb (71.215 kg)  BMI 24.04 kg/m2  LMP   Height: 5' 7.75" (172.1 cm) Ht Readings from Last 3 Encounters:  07/01/14 5' 7.75" (1.721 m)  05/29/14 5' 8.25" (1.734 m)  04/06/14 5' 8.25" (1.734 m)    General appearance: alert, cooperative and appears stated age Head: Normocephalic, without obvious abnormality, atraumatic Neck: no adenopathy, supple, symmetrical, trachea midline and thyroid normal to inspection and palpation Lungs: clear to auscultation bilaterally Breasts: normal appearance, no masses or tenderness, No nipple retraction or dimpling, No nipple discharge or bleeding, No axillary or supraclavicular adenopathy Heart: regular rate and rhythm Abdomen: soft, non-tender; no masses,  no organomegaly Extremities: extremities normal, atraumatic, no cyanosis or edema Skin: Skin color, texture, turgor normal. No rashes or lesions Lymph nodes: Cervical, supraclavicular, and axillary nodes normal. No abnormal inguinal nodes palpated Neurologic: Grossly normal   Pelvic: External genitalia:  no lesions              Urethra:  normal appearing urethra with no masses, tenderness or lesions              Bartholin's and Skene's: normal                 Vagina: normal appearing vagina with normal color and discharge, no lesions  Cervix: normal,non tender,no lesions              Pap taken: No. Bimanual Exam:  Uterus:  normal size, contour, position, consistency, mobility, non-tender and anteverted              Adnexa: normal adnexa and no mass, fullness, tenderness               Rectovaginal: Confirms               Anus:  normal appearance  Chaperone present: Yes  A:  Well Woman with normal exam  Contraception Nexplanon  Recent wrist injury under orthopedic follow up  P:   Reviewed health and wellness pertinent to exam  Aware of warning signs with Nexplanon and need to advise. Removal due 2018  Continue follow up as indicated  Pap smear not taken today   counseled on breast self exam, STD prevention, HIV risk  factors and prevention, adequate intake of calcium and vitamin D, diet and exercise  return annually or prn  An After Visit Summary was printed and given to the patient.

## 2014-07-02 NOTE — Progress Notes (Signed)
Reviewed personally.  M. Suzanne Eddy Termine, MD.  

## 2015-06-17 DIAGNOSIS — F3181 Bipolar II disorder: Secondary | ICD-10-CM | POA: Diagnosis not present

## 2015-07-02 ENCOUNTER — Ambulatory Visit: Payer: BLUE CROSS/BLUE SHIELD | Admitting: Certified Nurse Midwife

## 2015-07-07 DIAGNOSIS — J0140 Acute pansinusitis, unspecified: Secondary | ICD-10-CM | POA: Diagnosis not present

## 2015-07-08 ENCOUNTER — Ambulatory Visit: Payer: BLUE CROSS/BLUE SHIELD | Admitting: Certified Nurse Midwife

## 2015-07-19 DIAGNOSIS — F3181 Bipolar II disorder: Secondary | ICD-10-CM | POA: Diagnosis not present

## 2015-07-21 ENCOUNTER — Encounter: Payer: Self-pay | Admitting: Certified Nurse Midwife

## 2015-07-21 ENCOUNTER — Ambulatory Visit (INDEPENDENT_AMBULATORY_CARE_PROVIDER_SITE_OTHER): Payer: BLUE CROSS/BLUE SHIELD | Admitting: Certified Nurse Midwife

## 2015-07-21 VITALS — BP 108/70 | HR 72 | Resp 16 | Ht 67.75 in | Wt 172.0 lb

## 2015-07-21 DIAGNOSIS — Z01419 Encounter for gynecological examination (general) (routine) without abnormal findings: Secondary | ICD-10-CM

## 2015-07-21 DIAGNOSIS — Z124 Encounter for screening for malignant neoplasm of cervix: Secondary | ICD-10-CM

## 2015-07-21 DIAGNOSIS — R87619 Unspecified abnormal cytological findings in specimens from cervix uteri: Secondary | ICD-10-CM

## 2015-07-21 HISTORY — DX: Unspecified abnormal cytological findings in specimens from cervix uteri: R87.619

## 2015-07-21 NOTE — Progress Notes (Signed)
25 y.o. G0P0000 Single  Caucasian Fe here for annual exam. Contraception Nexplanon working well with amenorrhea and some spotting  which seems like a regular period. Getting married in 9/17, excited!  Recent left ankle sprain in brace, healing per patient. Has follow up scheduled with MD. Melanie RobertsonSees neurology for Lamictal and Ritalin/Prozac management. No other health issues today.  No LMP recorded. Patient has had an implant.          Sexually active: Yes.    The current method of family planning is nexplanon/ condoms( per above recommendation)    Exercising: Yes.    zumba Smoker:  no  Health Maintenance: Pap:  06-25-12 neg MMG:  none Colonoscopy:  none BMD:   none TDaP:  2017 Shingles: no Pneumonia: no Hep C and HIV:  Not done Labs: none Self breast exam: done occ   reports that she has never smoked. She does not have any smokeless tobacco history on file. She reports that she drinks about 1.8 oz of alcohol per week. She reports that she does not use illicit drugs.  Past Medical History  Diagnosis Date  . Inverse psoriasis   . IBS (irritable bowel syndrome)   . Allergy   . Depression   . Anxiety     History reviewed. No pertinent past surgical history.  Current Outpatient Prescriptions  Medication Sig Dispense Refill  . Betamethasone Valerate 0.12 % foam daily.    Marland Kitchen. etonogestrel (NEXPLANON) 68 MG IMPL implant 1 each by Subdermal route once.    Marland Kitchen. FLUoxetine (PROZAC) 40 MG capsule     . lamoTRIgine (LAMICTAL) 100 MG tablet Take 100 mg by mouth daily.    . methylphenidate (RITALIN) 20 MG tablet Take by mouth as needed.     No current facility-administered medications for this visit.    Family History  Problem Relation Age of Onset  . Cancer Father     skin  . Hypertension Father   . Cancer Maternal Grandmother     liver  . Hypertension Paternal Grandmother   . Asthma Brother     ROS:  Pertinent items are noted in HPI.  Otherwise, a comprehensive ROS was  negative.  Exam:   BP 108/70 mmHg  Pulse 72  Resp 16  Ht 5' 7.75" (1.721 m)  Wt 172 lb (78.019 kg)  BMI 26.34 kg/m2  LMP  Height: 5' 7.75" (172.1 cm) Ht Readings from Last 3 Encounters:  07/21/15 5' 7.75" (1.721 m)  07/01/14 5' 7.75" (1.721 m)  05/29/14 5' 8.25" (1.734 m)    General appearance: alert, cooperative and appears stated age Head: Normocephalic, without obvious abnormality, atraumatic Neck: no adenopathy, supple, symmetrical, trachea midline and thyroid normal to inspection and palpation Lungs: clear to auscultation bilaterally Breasts: normal appearance, no masses or tenderness, No nipple retraction or dimpling, No nipple discharge or bleeding, No axillary or supraclavicular adenopathy Heart: regular rate and rhythm Abdomen: soft, non-tender; no masses,  no organomegaly Extremities: extremities normal, atraumatic, no cyanosis or edema  Nexplanon palpated intact in left arm Skin: Skin color, texture, turgor normal. No rashes or lesions Lymph nodes: Cervical, supraclavicular, and axillary nodes normal. No abnormal inguinal nodes palpated Neurologic: Grossly normal   Pelvic: External genitalia:  no lesions              Urethra:  normal appearing urethra with no masses, tenderness or lesions              Bartholin's and Skene's: normal  Vagina: normal appearing vagina with normal color and discharge, no lesions              Cervix: no cervical motion tenderness, no lesions, nulliparous appearance and scant blood noted from cervix              Pap taken: Yes.   Bimanual Exam:  Uterus:  normal size, contour, position, consistency, mobility, non-tender and anteverted              Adnexa: normal adnexa and no mass, fullness, tenderness               Rectovaginal: Confirms               Anus:  normal sphincter tone, no lesions  Chaperone present: yes  A:  Well Woman with normal exam  Contraception Nexplanon due for Removal 10/23/15  Recent ankle sprain  left under follow up  Depression with MD management  P:   Reviewed health and wellness pertinent to exam  Reviewed warning signs and bleeding profile expectations.  Continue follow up as indicated  Pap smear as above with HPV reflex   counseled on breast self exam, STD prevention, HIV risk factors and prevention, adequate intake of calcium and vitamin D, diet and exercise  Wished well with wedding  return annually or prn  An After Visit Summary was printed and given to the patient.

## 2015-07-21 NOTE — Patient Instructions (Signed)
Etonogestrel implant What is this medicine? ETONOGESTREL (et oh noe JES trel) is a contraceptive (birth control) device. It is used to prevent pregnancy. It can be used for up to 3 years. This medicine may be used for other purposes; ask your health care provider or pharmacist if you have questions. What should I tell my health care provider before I take this medicine? They need to know if you have any of these conditions: -abnormal vaginal bleeding -blood vessel disease or blood clots -cancer of the breast, cervix, or liver -depression -diabetes -gallbladder disease -headaches -heart disease or recent heart attack -high blood pressure -high cholesterol -kidney disease -liver disease -renal disease -seizures -tobacco smoker -an unusual or allergic reaction to etonogestrel, other hormones, anesthetics or antiseptics, medicines, foods, dyes, or preservatives -pregnant or trying to get pregnant -breast-feeding How should I use this medicine? This device is inserted just under the skin on the inner side of your upper arm by a health care professional. Talk to your pediatrician regarding the use of this medicine in children. Special care may be needed. Overdosage: If you think you have taken too much of this medicine contact a poison control center or emergency room at once. NOTE: This medicine is only for you. Do not share this medicine with others. What if I miss a dose? This does not apply. What may interact with this medicine? Do not take this medicine with any of the following medications: -amprenavir -bosentan -fosamprenavir This medicine may also interact with the following medications: -barbiturate medicines for inducing sleep or treating seizures -certain medicines for fungal infections like ketoconazole and itraconazole -griseofulvin -medicines to treat seizures like carbamazepine, felbamate, oxcarbazepine, phenytoin,  topiramate -modafinil -phenylbutazone -rifampin -some medicines to treat HIV infection like atazanavir, indinavir, lopinavir, nelfinavir, tipranavir, ritonavir -St. John's wort This list may not describe all possible interactions. Give your health care provider a list of all the medicines, herbs, non-prescription drugs, or dietary supplements you use. Also tell them if you smoke, drink alcohol, or use illegal drugs. Some items may interact with your medicine. What should I watch for while using this medicine? This product does not protect you against HIV infection (AIDS) or other sexually transmitted diseases. You should be able to feel the implant by pressing your fingertips over the skin where it was inserted. Contact your doctor if you cannot feel the implant, and use a non-hormonal birth control method (such as condoms) until your doctor confirms that the implant is in place. If you feel that the implant may have broken or become bent while in your arm, contact your healthcare provider. What side effects may I notice from receiving this medicine? Side effects that you should report to your doctor or health care professional as soon as possible: -allergic reactions like skin rash, itching or hives, swelling of the face, lips, or tongue -breast lumps -changes in emotions or moods -depressed mood -heavy or prolonged menstrual bleeding -pain, irritation, swelling, or bruising at the insertion site -scar at site of insertion -signs of infection at the insertion site such as fever, and skin redness, pain or discharge -signs of pregnancy -signs and symptoms of a blood clot such as breathing problems; changes in vision; chest pain; severe, sudden headache; pain, swelling, warmth in the leg; trouble speaking; sudden numbness or weakness of the face, arm or leg -signs and symptoms of liver injury like dark yellow or brown urine; general ill feeling or flu-like symptoms; light-colored stools; loss of  appetite; nausea; right upper belly   pain; unusually weak or tired; yellowing of the eyes or skin -unusual vaginal bleeding, discharge -signs and symptoms of a stroke like changes in vision; confusion; trouble speaking or understanding; severe headaches; sudden numbness or weakness of the face, arm or leg; trouble walking; dizziness; loss of balance or coordination Side effects that usually do not require medical attention (Report these to your doctor or health care professional if they continue or are bothersome.): -acne -back pain -breast pain -changes in weight -dizziness -general ill feeling or flu-like symptoms -headache -irregular menstrual bleeding -nausea -sore throat -vaginal irritation or inflammation This list may not describe all possible side effects. Call your doctor for medical advice about side effects. You may report side effects to FDA at 1-800-FDA-1088. Where should I keep my medicine? This drug is given in a hospital or clinic and will not be stored at home. NOTE: This sheet is a summary. It may not cover all possible information. If you have questions about this medicine, talk to your doctor, pharmacist, or health care provider.    2016, Elsevier/Gold Standard. (2013-10-03 14:07:06)  

## 2015-07-25 NOTE — Progress Notes (Signed)
Encounter reviewed. Lamictal can potentially reduce the effectiveness of progesterone only contraception. I would recommend as an added security she use condoms.  Gertie Exon, MD

## 2015-07-28 LAB — IPS PAP TEST WITH REFLEX TO HPV

## 2015-07-29 ENCOUNTER — Other Ambulatory Visit: Payer: Self-pay | Admitting: Certified Nurse Midwife

## 2015-07-29 DIAGNOSIS — N760 Acute vaginitis: Secondary | ICD-10-CM

## 2015-07-29 DIAGNOSIS — R87611 Atypical squamous cells cannot exclude high grade squamous intraepithelial lesion on cytologic smear of cervix (ASC-H): Secondary | ICD-10-CM

## 2015-07-29 DIAGNOSIS — B9689 Other specified bacterial agents as the cause of diseases classified elsewhere: Secondary | ICD-10-CM

## 2015-07-29 MED ORDER — METRONIDAZOLE 500 MG PO TABS: 500.0000 mg | ORAL_TABLET | Freq: Two times a day (BID) | ORAL | 0 refills | Status: DC

## 2015-08-02 ENCOUNTER — Telehealth: Payer: Self-pay

## 2015-08-02 NOTE — Telephone Encounter (Signed)
Spoke with patient. Advised of message and results as seen below from Leota Sauers CNM. She is agreeable and verbalizes understanding. Patient has nexplanon for birth control. Reports she does not get a regular menses with the nexplanon. Aware she will need to complete Flagyl medication before colposcopy is performed. ETOH precautions given. Appointment for colposcopy scheduled for 08/11/2015 at 2 pm with Leota Sauers CNM. She is agreeable to date and time.  Instructions given. Motrin 800 mg po x , one hour before appointment with food. Make sure to eat a meal before appointment and drink plenty of fluids. Patient verbalized understanding and will call to reschedule if will be on menses or has any concerns regarding pregnancy. Order placed for precert.  Cc: Braxton Feathers  Routing to provider for final review. Patient agreeable to disposition. Will close encounter.

## 2015-08-02 NOTE — Telephone Encounter (Signed)
-----   Message from Verner Chol, CNM sent at 07/29/2015  8:47 PM EDT ----- Notify patient that pap smear is abnormal with a few ASCUS cells can not rule out high grade lesion  ASCUS-H needs colposcopy evaluation Please schedule  Order placed She also has BV per pap and will treatment prior to colpo.Marland Kitchen Rx Flagyl order placed

## 2015-08-02 NOTE — Telephone Encounter (Signed)
Left message to call Sahas Sluka at [phone]. 

## 2015-08-03 HISTORY — PX: COLPOSCOPY: SHX161

## 2015-08-10 ENCOUNTER — Telehealth: Payer: Self-pay | Admitting: Certified Nurse Midwife

## 2015-08-10 NOTE — Telephone Encounter (Signed)
Patient has a colposcopy appointment tomorrow and was told to call if she has any bleeding. Patient is asking if she needs to reschedule?

## 2015-08-10 NOTE — Telephone Encounter (Signed)
Return call to patient. Has Nexplanon in place and does not usually have bleeding. Has light bleeding now.  Advised to monitor overnight and see if resolves, if just light spotting or stops will be able to proceed. Advised if any kind of flow, will need to reschedule.  Patient agreeable.  Routing to provider for final review. Patient agreeable to disposition. Will close encounter.

## 2015-08-11 ENCOUNTER — Telehealth: Payer: Self-pay | Admitting: Certified Nurse Midwife

## 2015-08-11 ENCOUNTER — Ambulatory Visit: Payer: BLUE CROSS/BLUE SHIELD | Admitting: Certified Nurse Midwife

## 2015-08-11 NOTE — Telephone Encounter (Signed)
Yes it is fine

## 2015-08-11 NOTE — Telephone Encounter (Signed)
Spoke with patient. Patient has a nexplanon in place. Reports her bleeding has increased today to the flow of a normal cycle. Reports she was advised to reschedule colposcopy if she started her cycle. Requesting to reschedule for next Monday, Tuesday, or Wednesday. Limited procedure slots. Appointment scheduled for 08/18/2015 at 11 am with [name] CNM. She is agreeable to date and time.  [name] CNM, okay to keep as scheduled?

## 2015-08-11 NOTE — Telephone Encounter (Signed)
Patient needs to reschedule colposcopy today. Patient said her cycle has gotten heavier and was told d to call if this happened.

## 2015-08-18 ENCOUNTER — Encounter: Payer: Self-pay | Admitting: Certified Nurse Midwife

## 2015-08-18 ENCOUNTER — Ambulatory Visit (INDEPENDENT_AMBULATORY_CARE_PROVIDER_SITE_OTHER): Payer: BLUE CROSS/BLUE SHIELD | Admitting: Certified Nurse Midwife

## 2015-08-18 VITALS — BP 114/70 | HR 72 | Resp 16 | Ht 67.75 in | Wt 167.0 lb

## 2015-08-18 DIAGNOSIS — R87611 Atypical squamous cells cannot exclude high grade squamous intraepithelial lesion on cytologic smear of cervix (ASC-H): Secondary | ICD-10-CM

## 2015-08-18 NOTE — Progress Notes (Signed)
07-21-15 ASCUS cant rule out high grade lesion Pt took 800mg  ibuprofen at 10am.

## 2015-08-18 NOTE — Patient Instructions (Signed)

## 2015-08-18 NOTE — Progress Notes (Signed)
Patient ID: Melanie Oneill, female   DOB: Oct 14, 1990, 25 y.o.   MRN: [id number]  Chief Complaint  Patient presents with  . Colposcopy    //jj    HPI Melanie Oneill is a 25 y.o. female.  Denies pelvic pain or vaginal bleeding. Patient finished medication for BV and had a normal period. Contraception Nexplanon. Patient took Gardasil series.  HPI  Indications: Pap smear on 7/9 2017 showed: a few atypical cells of undetermined significance can no rule out HSIL. Previous colposcopy: none. Previous treatment none..  Past Medical History:  Diagnosis Date  . Abnormal Pap smear of cervix 07/21/2015   ASCUS cant rule out high grade  . Allergy   . Anxiety   . Depression   . IBS (irritable bowel syndrome)   . Inverse psoriasis     No past surgical history on file.  Family History  Problem Relation Age of Onset  . Cancer Father     skin  . Hypertension Father   . Cancer Maternal Grandmother     liver  . Hypertension Paternal Grandmother   . Asthma Brother     Social History Social History  Substance Use Topics  . Smoking status: Never Smoker  . Smokeless tobacco: Never Used  . Alcohol use 1.8 oz/week    3 Standard drinks or equivalent per week    Allergies  Allergen Reactions  . Wheat Bran Anaphylaxis    Current Outpatient Prescriptions  Medication Sig Dispense Refill  . Betamethasone Valerate 0.12 % foam daily.    Marland Kitchen. etonogestrel (NEXPLANON) 68 MG IMPL implant 1 each by Subdermal route once.    Marland Kitchen. FLUoxetine (PROZAC) 40 MG capsule     . lamoTRIgine (LAMICTAL) 100 MG tablet Take 100 mg by mouth daily.    . methylphenidate (RITALIN) 20 MG tablet Take by mouth as needed.     No current facility-administered medications for this visit.     Review of Systems Review of Systems  Constitutional: Negative.   Genitourinary: Negative for vaginal bleeding, vaginal discharge and vaginal pain.    Blood pressure 114/70, pulse 72, resp. rate 16, height 5' 7.75" (1.721 m), weight  167 lb (75.8 kg), last menstrual period 08/10/2015.  Physical Exam Physical Exam  Constitutional: She is oriented to person, place, and time. She appears well-developed and well-nourished.  Genitourinary: Vagina normal. There is no rash, tenderness or lesion on the right labia. There is no rash, tenderness or lesion on the left labia.    Neurological: She is alert and oriented to person, place, and time.  Skin: Skin is warm and dry.  Psychiatric: She has a normal mood and affect. Her behavior is normal. Judgment and thought content normal.    Data Reviewed Reviewed pap smear results. Discussed HPVHR+ and etiology and transmission. Questions addressed. Information given.  Assessment    Procedure Details  The risks and benefits of the procedure and Written informed consent obtained.  Speculum placed in vagina and excellent visualization of cervix achieved, cervix swabbed x 3 with saline and  with acetic acid solution. Cervix viewed with 3.75,7.5, 15# with green filter. Acetowhite lesion note at 12 - 1 o'clock and 5 o'clock. Lugol's applied with non staining noted. Biopsy taken at 5 o'clock and at 12-1 o'clock.  ECC obtained. Monsel's applied with bleeding noted. Silver nitrate applied to 12-1 o'clock site. No bleeding noted on removal of speculum. Patient monitored for bleeding for 30 minutes, emptied bladder and scant spotting noted on mini pad. Pad changed and  no active bleeding noted on pad, just Monsel's residue. Patient tolerated procedure well. Instructions given both verbal and written.  Specimens: 3  Complications: none.     Plan    Specimens labelled and sent to Pathology. Patient will advised of results once reviewed. Pathology reviewed and biopsy at 5 o'clock showed benign squamous mucosa. Biopsy at 12-1 o'clock showed LSIL, no high grade noted. ECC showed benign glandular endocervical epithelium. Patient will be notified and aware she needs pap smear with HPVHR screening  for the next two years. Pap recall will be placed.      LEONARD,DEBORAH 08/18/2015, 11:30 AM

## 2015-08-20 LAB — IPS OTHER TISSUE BIOPSY

## 2015-08-21 NOTE — Progress Notes (Signed)
Encounter reviewed Deari Sessler, MD   

## 2015-08-24 ENCOUNTER — Telehealth: Payer: Self-pay

## 2015-08-24 NOTE — Telephone Encounter (Signed)
Left message to call Kaitlyn at [phone]. 

## 2015-08-24 NOTE — Telephone Encounter (Signed)
-----   Message from Verner Choleborah S Leonard, CNM sent at 08/20/2015  2:53 PM EDT ----- Notify patient that her colposcopy biopsy at 5 o'clock showed benign squamous mucosa Biopsy at 12-1 o'clock showed Low grade squamous intraepithelial lesion, not high grade ECC showed benign glandular endocervical epithelium Patient needs repeat pap smear with HPVHR for the next two years. Very important for her to have this follow up. Pap recall 08

## 2015-08-27 NOTE — Telephone Encounter (Signed)
Call to patient. Notified of colpo results as directed by Lovett Soxebbi Leonard, CNM. Stressed importance of follow-up pap and HPV testing with annual exam due next July. patient declines to schedule at this time but states she will call back to schedule.   08 recall entered for 08-01-16. Encounter closed.

## 2015-08-31 DIAGNOSIS — F3181 Bipolar II disorder: Secondary | ICD-10-CM | POA: Diagnosis not present

## 2015-10-06 DIAGNOSIS — F3181 Bipolar II disorder: Secondary | ICD-10-CM | POA: Diagnosis not present

## 2015-10-26 DIAGNOSIS — F3181 Bipolar II disorder: Secondary | ICD-10-CM | POA: Diagnosis not present

## 2015-12-01 DIAGNOSIS — F3181 Bipolar II disorder: Secondary | ICD-10-CM | POA: Diagnosis not present

## 2016-01-27 DIAGNOSIS — F3181 Bipolar II disorder: Secondary | ICD-10-CM | POA: Diagnosis not present

## 2016-03-01 ENCOUNTER — Ambulatory Visit (INDEPENDENT_AMBULATORY_CARE_PROVIDER_SITE_OTHER): Payer: BLUE CROSS/BLUE SHIELD | Admitting: Certified Nurse Midwife

## 2016-03-01 ENCOUNTER — Encounter: Payer: Self-pay | Admitting: Certified Nurse Midwife

## 2016-03-01 VITALS — BP 94/60 | HR 64 | Resp 16 | Ht 67.75 in | Wt 184.0 lb

## 2016-03-01 DIAGNOSIS — B3731 Acute candidiasis of vulva and vagina: Secondary | ICD-10-CM

## 2016-03-01 DIAGNOSIS — B373 Candidiasis of vulva and vagina: Secondary | ICD-10-CM

## 2016-03-01 MED ORDER — FLUCONAZOLE 150 MG PO TABS: 150.0000 mg | ORAL_TABLET | Freq: Once | ORAL | 0 refills | Status: AC

## 2016-03-01 MED ORDER — NYSTATIN-TRIAMCINOLONE 100000-0.1 UNIT/GM-% EX CREA: 1.0000 "application " | TOPICAL_CREAM | Freq: Two times a day (BID) | CUTANEOUS | 0 refills | Status: DC

## 2016-03-01 NOTE — Progress Notes (Signed)
26 y.o. Married Caucasian female G0P0000 here with complaint of vaginal symptoms of itching, burning, and increase discharge. Describes discharge as yellow thin, slight odor .Onset of symptoms 14 days ago. Denies new personal products.No  STD concerns. Urinary symptoms none . Contraception is Nexplanon, now having light period, no issues. Plans to change to OCP when replacement due. Has tried Tea tree oil on area in vagina with a little relief, but now very dry and uncomfortable. History of Psoriasis and medication has not helped. No other health issues today.  ROS pertinent to HPI  O:Healthy female WDWN Affect: normal, orientation x 3  Exam:Skin : warm and dry Abdomen:soft, non tender Inguinal Lymph nodes: no enlargement or tenderness Pelvic exam: External genital: normal female with redness, scaling and exudate on vulva area bilateral, no lesions wet prep taken BUS: negative Vagina: yellow white thick non odorous discharge noted. Small crack in skin at introitus also  Ph:3.5   ,Wet prep taken,  Cervix: normal, non tender, no CMT Uterus: normal, non tender Adnexa:normal, non tender, no masses or fullness noted   Wet Prep results:KOH, Saline + yeast vaginal and skin   A:Normal pelvic exam Contraception Nexplanon Yeast vaginitis,vulvitis History of Psoriasis    P:Discussed findings of yeast vaginitis/vulvitis and etiology. Discussed  baking soda sitz bath for comfort. Avoid moist clothes or pads for extended period of time. If working out in gym clothes or swim suits for long periods of time change underwear  if possible. Coconut Oil use for skin protection prior to activity can be used to external skin for protection or dryness. Rx: Diflucan see order with instructions Rx Mycolog generic see order with instructions Patient to advise if no change in status and come in.  Rv prn

## 2016-03-01 NOTE — Patient Instructions (Signed)

## 2016-03-01 NOTE — Progress Notes (Signed)
Encounter reviewed Sherod Cisse, MD   

## 2016-03-06 ENCOUNTER — Telehealth: Payer: Self-pay | Admitting: Certified Nurse Midwife

## 2016-03-06 MED ORDER — FLUCONAZOLE 150 MG PO TABS: 150.0000 mg | ORAL_TABLET | Freq: Once | ORAL | 0 refills | Status: AC

## 2016-03-06 NOTE — Telephone Encounter (Signed)
Patient is out of town and forgot to bring the Diflucan with her.  She would like to know if we can call in one pill for her to [facility] on [address] [place]

## 2016-03-06 NOTE — Telephone Encounter (Signed)
Spoke with patient, advised order for diflucan sent to requested pharmacy, f/u with pharmacy for filling. Patient verbalizes understanding and is agreeable.  Routing to provider for final review. Patient is agreeable to disposition. Will close encounter.

## 2016-03-06 NOTE — Telephone Encounter (Signed)
OK for order for Diflucan.

## 2016-03-06 NOTE — Telephone Encounter (Signed)
Ria CommentPatricia Grubb, NP -see patient message below, ok to send diflucan #1/0RF? Patient seen in office by [name], CNM 03/01/16.  Cc: [name], CNM

## 2016-03-28 DIAGNOSIS — F3181 Bipolar II disorder: Secondary | ICD-10-CM | POA: Diagnosis not present

## 2016-03-31 DIAGNOSIS — H66001 Acute suppurative otitis media without spontaneous rupture of ear drum, right ear: Secondary | ICD-10-CM | POA: Diagnosis not present

## 2016-04-17 ENCOUNTER — Telehealth: Payer: Self-pay | Admitting: Certified Nurse Midwife

## 2016-04-17 NOTE — Telephone Encounter (Signed)
Spoke with patient. Patient states she would like to schedule nexplanon removal and start OCP. Patient states she would like to start OCP 1 month prior to having nexplanon removed. Patient states she wants to ensure contraceptive is working. Patient states she and spouse will be planning for pregnancy in future and would like to transition. Advised patient would need to review with Leota Sauers, CNM for recommendations prior to scheduling removal. Advised patient Leota Sauers, CNM is out of the office today, can review on 4/17 when she returns and return call with recommendations, patient is agreeable.   Leota Sauers, CNM -please advise on starting OCP before nexplanon removal?

## 2016-04-17 NOTE — Telephone Encounter (Signed)
Patient wants Nexplanon removed and wants to go on birth control pill.

## 2016-04-19 ENCOUNTER — Other Ambulatory Visit: Payer: Self-pay | Admitting: Certified Nurse Midwife

## 2016-04-19 DIAGNOSIS — Z30011 Encounter for initial prescription of contraceptive pills: Secondary | ICD-10-CM

## 2016-04-19 MED ORDER — NORETHIN ACE-ETH ESTRAD-FE 1-20 MG-MCG(24) PO TABS: 1.0000 | ORAL_TABLET | Freq: Every day | ORAL | 0 refills | Status: DC

## 2016-04-19 NOTE — Telephone Encounter (Signed)
Ok to start on OCP with next period.  Placed order for Loestrin 24 Fe. Please give instructions.

## 2016-04-19 NOTE — Telephone Encounter (Signed)
Left detailed message, ok per current dpr. Advised patient ok to start OCP with next period, RX for Loestrin Fe sent to pharmacy on file, need to clarify this pharmacy as it may have been a temporary pharmacy. Advised patient to return call to office at [phone] for confirmation of pharmacy, may schedule nexplanon removal after starting OCP.

## 2016-04-20 DIAGNOSIS — F3131 Bipolar disorder, current episode depressed, mild: Secondary | ICD-10-CM | POA: Diagnosis not present

## 2016-04-21 NOTE — Telephone Encounter (Signed)
Left message to call Srikar Chiang at [phone].  

## 2016-04-25 NOTE — Telephone Encounter (Signed)
Melanie Oneill, CNM -left detailed message and f/u call, no return call, will close encounter.  Routing to provider for final review. Patient is agreeable to disposition. Will close encounter.

## 2016-05-15 ENCOUNTER — Telehealth: Payer: Self-pay | Admitting: Certified Nurse Midwife

## 2016-05-15 DIAGNOSIS — Z3046 Encounter for surveillance of implantable subdermal contraceptive: Secondary | ICD-10-CM

## 2016-05-15 NOTE — Telephone Encounter (Signed)
Spoke with patient. Patient desire removal of nexplanon. Patient scheduled for removal on 05/17/16 at 3pm with Dr. Edward JollySilva. Patient verbalizes understanding and is agreeable.  Order placed.  Routing to provider for final review. Patient is agreeable to disposition. Will close encounter.  Cc: [name], CNM, Braxton Feathersebecca Frahm

## 2016-05-15 NOTE — Progress Notes (Signed)
GYNECOLOGY  VISIT   HPI: 26 y.o.   Married  Caucasian  female   G0P0000 with Melanie Oneill's last menstrual period was 03/16/2016 (approximate).   here for Nexplanon removal.   Now is off combined oral contraceptives.   Wants to start a family.  Still is on Prozac.   Melanie Oneill states Neg UPT at home  GYNECOLOGIC HISTORY: Melanie Oneill's last menstrual period was 03/16/2016 (approximate). Contraception:  Nexplanon inserted 10-22-13--left arm Menopausal hormone therapy:  n/a Last mammogram:  n/a Last pap smear: 07-21-15 ASC-H:Pos HR HPV--colpo cx bx revealed LGSIL with ECC Neg' 06-25-12 pap Neg         OB History    Gravida Para Term Preterm AB Living   0   0         SAB TAB Ectopic Multiple Live Births                     Melanie Oneill Active Problem List   Diagnosis Date Noted  . Depression, major, recurrent (HCC) 11/14/2011    Past Medical History:  Diagnosis Date  . Abnormal Pap smear of cervix 07/21/2015   ASCUS cant rule out high grade  . Allergy   . Anxiety   . Depression   . IBS (irritable bowel syndrome)   . Inverse psoriasis     Past Surgical History:  Procedure Laterality Date  . nexplanon insertion     10-22-13    Current Outpatient Prescriptions  Medication Sig Dispense Refill  . clobetasol (OLUX) 0.05 % topical foam     . etonogestrel (NEXPLANON) 68 MG IMPL implant 1 each by Subdermal route once.    Marland Kitchen FLUoxetine (PROZAC) 40 MG capsule     . hydrocortisone 2.5 % ointment      No current facility-administered medications for this visit.      ALLERGIES: Wheat bran  Family History  Problem Relation Age of Onset  . Cancer Father        skin  . Hypertension Father   . Cancer Maternal Grandmother        liver  . Hypertension Paternal Grandmother   . Asthma Brother     Social History   Social History  . Marital status: Married    Spouse name: N/A  . Number of children: N/A  . Years of education: N/A   Occupational History  . Not on file.   Social  History Main Topics  . Smoking status: Never Smoker  . Smokeless tobacco: Never Used  . Alcohol use 1.8 oz/week    3 Standard drinks or equivalent per week  . Drug use: No  . Sexual activity: Yes    Partners: Male    Birth control/ protection: Implant     Comment: nexplanon inserted 10-22-13 left arm   Other Topics Concern  . Not on file   Social History Narrative  . No narrative on file    ROS:  Pertinent items are noted in HPI.  PHYSICAL EXAMINATION:    BP 128/60 (BP Location: Right Arm, Melanie Oneill Position: Sitting, Cuff Size: Large)   Pulse 70   Ht 5' 7.75" (1.721 m)   Wt 190 lb 9.6 oz (86.5 kg)   LMP 03/16/2016 (Approximate)   BMI 29.19 kg/m     General appearance: alert, cooperative and appears stated age   Procedure - Nexplanon removal. Left arm:  Nexplanon present in the left arm.  Sterile prep with betadine. Local 1% lidocaine, lot number [id number], exp 02/21. Incision with scalpel  over insertion site.  Hemostat used to remove Nexplanon intact and discarded.  Pressure dressing and gauze wrapping placed.  No complications.  Minimal EBL.  ASSESSMENT  Depression and anxiety.  Nexplanon Melanie Oneill.   PLAN  Up to Date review of literature about Prozac in pregnancy.  Possible increased risk of congenital and cardiac birth defects reviewed along with the increased risk of postpartum hemorrhage.  She will discuss Zoloft with her prescriber as an alternative to the Prozac.  Start PNV.  Use condoms for pregnancy prevention until she has further discussion about her SSRIs with her prescriber. Call for redness, pain in arm or fever.  Return for annual exam and pregnancy confirmation.    An After Visit Summary was printed and given to the Melanie Oneill.  _15____ minutes face to face time of which over 50% was spent in counseling.

## 2016-05-15 NOTE — Telephone Encounter (Signed)
Left message to call Mattison Golay at [phone].  

## 2016-05-15 NOTE — Telephone Encounter (Signed)
Patient wants to schedule an appointment to have her IUD removed. °

## 2016-05-17 ENCOUNTER — Ambulatory Visit (INDEPENDENT_AMBULATORY_CARE_PROVIDER_SITE_OTHER): Payer: BLUE CROSS/BLUE SHIELD | Admitting: Obstetrics and Gynecology

## 2016-05-17 ENCOUNTER — Encounter: Payer: Self-pay | Admitting: Obstetrics and Gynecology

## 2016-05-17 VITALS — BP 128/60 | HR 70 | Ht 67.75 in | Wt 190.6 lb

## 2016-05-17 DIAGNOSIS — F32A Depression, unspecified: Secondary | ICD-10-CM

## 2016-05-17 DIAGNOSIS — Z3046 Encounter for surveillance of implantable subdermal contraceptive: Secondary | ICD-10-CM

## 2016-05-17 DIAGNOSIS — F329 Major depressive disorder, single episode, unspecified: Secondary | ICD-10-CM

## 2016-05-17 DIAGNOSIS — F419 Anxiety disorder, unspecified: Secondary | ICD-10-CM

## 2016-05-17 NOTE — Patient Instructions (Signed)
Please call for redness or pain in your arm or fever.  Start you prenatal vitamin hopefully with DHA added.

## 2016-05-26 DIAGNOSIS — F3181 Bipolar II disorder: Secondary | ICD-10-CM | POA: Diagnosis not present

## 2016-05-30 ENCOUNTER — Ambulatory Visit (INDEPENDENT_AMBULATORY_CARE_PROVIDER_SITE_OTHER): Payer: BLUE CROSS/BLUE SHIELD | Admitting: Certified Nurse Midwife

## 2016-05-30 ENCOUNTER — Encounter: Payer: Self-pay | Admitting: Certified Nurse Midwife

## 2016-05-30 VITALS — BP 104/60 | HR 64 | Resp 16 | Ht 67.75 in | Wt 191.0 lb

## 2016-05-30 DIAGNOSIS — Z3169 Encounter for other general counseling and advice on procreation: Secondary | ICD-10-CM | POA: Diagnosis not present

## 2016-05-30 NOTE — Progress Notes (Signed)
26 y.o.Married Caucasian gopo Female here for preconceptual counseling. Patient is currently not using contraception and hopeful for pregnancy. Had Nexplanon  Removed on 05/17/16. LMP 05/22/16. Patient had spoke with Dr Edward JollySilva regarding medication changes of her anxiety depression medication. She now is only on Prozac. She discussed with her psychiatrist regarding her Lamictal and Ritalin and Prozac. She has weaned off Lamictal and Ritalin now without problems. She researched the Prozac and feels this is the safest for her without the her combinations. She is consuming at least 12 drinks of alcohol (home brewed) per week. She is aware this is not a good choice for pregnancy. Really wanted to try to conceive now. Has started an exercise program to help with no medications and weight loss. Spouse supportive. No other health issues today.  Gynecological History:    Menarche:age 17 LMP: 05/21/16  Length of cycle:28-32 days Length of Menses: 3-5 days GYN infectious disease history:  (Abnormal pap, venereal warts, herpes, or other STD's ) ASCUS H 07/21/15 with colpo which showed LSIL Current Birth Control method:*Nexplanon Last time birth control was used:05/15/16  PMH:  Any history of DM, HTN, epilepsy, Heart Murmur, or thyroid problems? No   If so, when did it begin? Are you or have you ever been anemic?No If so for how long? Have you ever had any accidents? No What type? Do you have any allergies? Yes Do you take any sedatives or tranquilizers? Yes previous use of Lamictal, now only on Prozac Any domestic violence? No Any medications? No   (such as medicationss for acne - certain medications can cause birth defects and Ace inhibitors can cause kidney problems in the fetus.)  Patient's Past Medical History:  Have you ever had surgery related to female organs? No Past pregnancies/ complications/ or miscarriages/ abortions? No ETOH? Yes Tobacco Use?No Drug use? No  Reviewed Medication list:  Yes Current job exposure risk - toxins/ Lead/ Mercury No Hot tub/ sauna use? No Do you commonly run long distance or do strenuous exercise? No Do you eat a strict vegetarian diet? No  Partners Past Medical History: Answered by patient partner not present  Have you ever had surgery related to female organs? No Previous Paternity? No Testicular Injury? 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 No Do you commonly run long distance or do strenuous exercise? No   Patient's Family Medical history: Yes Partners Family Medical History: No  Ethnic background? Meditterranean/ Asian/Chinese/ Ashkenazi Jews / [place] Dutch /[place] / [place] French - [place]   Patients FMH:      Partners FMH: Multiple Births No   Multiple Births No Genetic Disorders No   Genetic Disorders No  Sickle cell      Sickle Cell  Hemophilia      Hemophila  Cystic Fibrosis     Cystic Fibrosis  Mental retardation     Mental retardation  Downs Syndrome     Downs Syndrome  Immunization Updates: Rubella Vaccine/ titer Yes  Lab to be drawn today Chicken Pox / vaccination Yes Toxoplasmosis exposure (no changing litter box) No Tdap for pt. and partner Yes patient UTD, partner she will check on Hepatitis B (if at high risk) No Influenza vaccine who may get pregnant during the flu season No   A: Healthy female here for preconceptual consultation Contraception none at  present  Recent Nexplanon removal History of anxiety/depression with Prozac and Lamictal use, now on Prozac only Alcohol use ? Self medicating since stopping Lamictal TDAP UTD Partner history from patient only  P: Recommendations: Discussed with patient that she has increased her alcohol use and ? Related to stopping Lamictal. Patient does home brew,which is  always available. Counseled on concerns with FAS in infant with use and would encourage patient to resume contraception of condoms until she has decreased or stopped all ETOH use. If she is having difficulty doing this needs to advise. It is not recommended in pregnancy. Discussed importance of giving herself time to adjust after stopping medication and working on diet, exercise to improve overall health before trying for pregnancy. Patient agrees this is important and will consider condom use for the next few months and will work on stopping alcohol use. Discussed importance of prenatal vitamins daily( has started). Has menstrual app on phone and will be tracking cycles to help with prevention of pregnancy until feels better with Prozac only use. May consider MFM consult from all previous medication use. Encouraged to limit caffeine and artificial sweeteners in diet. Avoid Tobacco products or second hand smoke. Discussed importance of Rubella screen, request today. Discussed normal pregnancy rate of 80 % within one year of no contraception. Questions addressed at length.  Labs: Rubella screen  Rv prn, as above, aex due 7/18  Time spent with patient in face to face consultation 38 minutes.

## 2016-05-30 NOTE — Patient Instructions (Signed)

## 2016-05-31 LAB — RUBELLA SCREEN: RUBELLA: 3.59 {index} — AB (ref <0.90)

## 2016-06-19 DIAGNOSIS — F3181 Bipolar II disorder: Secondary | ICD-10-CM | POA: Diagnosis not present

## 2016-06-30 ENCOUNTER — Ambulatory Visit (INDEPENDENT_AMBULATORY_CARE_PROVIDER_SITE_OTHER): Payer: BLUE CROSS/BLUE SHIELD | Admitting: Obstetrics and Gynecology

## 2016-06-30 ENCOUNTER — Encounter: Payer: Self-pay | Admitting: Obstetrics and Gynecology

## 2016-06-30 VITALS — BP 114/66 | HR 80 | Resp 16 | Wt 195.0 lb

## 2016-06-30 DIAGNOSIS — N760 Acute vaginitis: Secondary | ICD-10-CM

## 2016-06-30 DIAGNOSIS — N920 Excessive and frequent menstruation with regular cycle: Secondary | ICD-10-CM | POA: Diagnosis not present

## 2016-06-30 LAB — POCT URINE PREGNANCY: PREG TEST UR: NEGATIVE

## 2016-06-30 MED ORDER — FLUCONAZOLE 150 MG PO TABS: 150.0000 mg | ORAL_TABLET | Freq: Once | ORAL | 0 refills | Status: AC

## 2016-06-30 NOTE — Patient Instructions (Signed)

## 2016-06-30 NOTE — Progress Notes (Signed)
GYNECOLOGY  VISIT   HPI: 26 y.o.   Married  Caucasian  female   G0P0000 with Patient's last menstrual period was 05/21/2016.   here for vaginal irritation, spotting, no period since May 21, 2016. Patient had Nexplanon which was removed 05/17/16.  Had menses monthly prior to Nexplanon.   Having itching on the vulva and the vaginal area.  Monistat not really helpful.  Hydrocortisone cream also did not help.  No discharge.  Maybe some odor.   No recent abx.   Going to the gym a lot.   Has Psoriasis.  UPT: Negative  GYNECOLOGIC HISTORY: Patient's last menstrual period was 05/21/2016. Contraception: None Menopausal hormone therapy:  n/a Last mammogram:  n/a Last pap smear:    07-21-15 ASC-H:Pos HR HPV--colpo cx bx revealed LGSIL with ECC Neg' 06-25-12 pap Neg        OB History    Gravida Para Term Preterm AB Living   0   0         SAB TAB Ectopic Multiple Live Births                     Patient Active Problem List   Diagnosis Date Noted  . Depression, major, recurrent (HCC) 11/14/2011    Past Medical History:  Diagnosis Date  . Abnormal Pap smear of cervix 07/21/2015   ASCUS cant rule out high grade  . Allergy   . Anxiety   . Depression   . IBS (irritable bowel syndrome)   . Inverse psoriasis     Past Surgical History:  Procedure Laterality Date  . nexplanon insertion     10-22-13, removed 05-17-16    Current Outpatient Prescriptions  Medication Sig Dispense Refill  . FLUoxetine (PROZAC) 40 MG capsule     . hydrocortisone 2.5 % ointment      No current facility-administered medications for this visit.      ALLERGIES: Wheat bran  Family History  Problem Relation Age of Onset  . Cancer Father        skin  . Hypertension Father   . Cancer Maternal Grandmother        liver  . Hypertension Paternal Grandmother   . Asthma Brother     Social History   Social History  . Marital status: Married    Spouse name: N/A  . Number of children: N/A  .  Years of education: N/A   Occupational History  . Not on file.   Social History Main Topics  . Smoking status: Never Smoker  . Smokeless tobacco: Never Used  . Alcohol use 1.8 oz/week    3 Standard drinks or equivalent per week  . Drug use: No  . Sexual activity: Yes    Partners: Male    Birth control/ protection: None   Other Topics Concern  . Not on file   Social History Narrative  . No narrative on file    ROS:  Pertinent items are noted in HPI.  PHYSICAL EXAMINATION:    BP 114/66 (BP Location: Right Arm, Patient Position: Sitting, Cuff Size: Normal)   Pulse 80   Resp 16   Wt 195 lb (88.5 kg)   LMP 05/21/2016   BMI 29.87 kg/m     General appearance: alert, cooperative and appears stated age.   Pelvic: External genitalia:  Generalized erythema of the vulva.  Fissures of the perineum.               Urethra:  normal  appearing urethra with no masses, tenderness or lesions              Bartholins and Skenes: normal                 Vagina: normal appearing vagina with normal color and discharge, no lesions              Cervix: no lesions.  Dark blood noted.                 Bimanual Exam:  Uterus:  normal size, contour, position, consistency, mobility, non-tender              Adnexa: no mass, fullness, tenderness  Chaperone was present for exam.  ASSESSMENT  Status post Nexplanon removal.  Spotting today.  Looks like menses starting.  Yeast vulvitis.  Hx LGSIL.  PLAN  Affirm.  Start Diflucan.  Declines Rx for Mycolog.  Already has hydorcortisone. Return for annual exam and pap with Sara Chu.    An After Visit Summary was printed and given to the patient.  __15__ minutes face to face time of which over 50% was spent in counseling.

## 2016-07-01 LAB — VAGINITIS/VAGINOSIS, DNA PROBE
Candida Species: NEGATIVE
Gardnerella vaginalis: NEGATIVE
Trichomonas vaginosis: NEGATIVE

## 2016-07-03 ENCOUNTER — Telehealth: Payer: Self-pay | Admitting: Obstetrics and Gynecology

## 2016-07-03 NOTE — Telephone Encounter (Signed)
Patient called and stated she is returning Taylor's call from this morning but there is no open phone note.

## 2016-07-03 NOTE — Telephone Encounter (Signed)
Left message to call Noreene LarssonJill at [phone].   Notes recorded by [name], Brook E, MD on 07/02/2016 at 7:54 PM EDT Please report negative Affirm testing.  Patient had signs of yeast vulvitis so I treated her with Diflucan.  This should resolve the external yeast infection.   I am highlighting this so you know to call the patient.

## 2016-07-04 NOTE — Telephone Encounter (Signed)
Spoke with patient, advised of results as seen below per Dr. Edward JollySilva. Patient states she has not picked up diflucan yet, will plan to pick up today. Patient verbalizes understanding and is agreeable.  Routing to provider for final review. Patient is agreeable to disposition. Will close encounter.

## 2016-07-04 NOTE — Telephone Encounter (Signed)
Patient is returning call to nurse.

## 2016-07-12 DIAGNOSIS — F3181 Bipolar II disorder: Secondary | ICD-10-CM | POA: Diagnosis not present

## 2016-07-20 ENCOUNTER — Ambulatory Visit (INDEPENDENT_AMBULATORY_CARE_PROVIDER_SITE_OTHER): Payer: BLUE CROSS/BLUE SHIELD | Admitting: Certified Nurse Midwife

## 2016-07-20 ENCOUNTER — Other Ambulatory Visit (HOSPITAL_COMMUNITY)
Admission: RE | Admit: 2016-07-20 | Discharge: 2016-07-20 | Disposition: A | Discharge status: Home / Self Care | Payer: BLUE CROSS/BLUE SHIELD | Source: Ambulatory Visit | Attending: Certified Nurse Midwife | Admitting: Certified Nurse Midwife

## 2016-07-20 ENCOUNTER — Encounter: Payer: Self-pay | Admitting: Certified Nurse Midwife

## 2016-07-20 VITALS — BP 118/72 | HR 64 | Resp 16 | Ht 67.75 in | Wt 196.0 lb

## 2016-07-20 DIAGNOSIS — Z124 Encounter for screening for malignant neoplasm of cervix: Secondary | ICD-10-CM | POA: Diagnosis not present

## 2016-07-20 DIAGNOSIS — F329 Major depressive disorder, single episode, unspecified: Secondary | ICD-10-CM | POA: Diagnosis not present

## 2016-07-20 DIAGNOSIS — Z8659 Personal history of other mental and behavioral disorders: Secondary | ICD-10-CM | POA: Diagnosis not present

## 2016-07-20 DIAGNOSIS — K589 Irritable bowel syndrome without diarrhea: Secondary | ICD-10-CM | POA: Diagnosis not present

## 2016-07-20 DIAGNOSIS — Z87898 Personal history of other specified conditions: Secondary | ICD-10-CM

## 2016-07-20 DIAGNOSIS — F419 Anxiety disorder, unspecified: Secondary | ICD-10-CM | POA: Insufficient documentation

## 2016-07-20 DIAGNOSIS — Z01419 Encounter for gynecological examination (general) (routine) without abnormal findings: Secondary | ICD-10-CM | POA: Insufficient documentation

## 2016-07-20 DIAGNOSIS — L408 Other psoriasis: Secondary | ICD-10-CM | POA: Diagnosis not present

## 2016-07-20 DIAGNOSIS — Z8742 Personal history of other diseases of the female genital tract: Secondary | ICD-10-CM

## 2016-07-20 NOTE — Progress Notes (Signed)
26 y.o. G0P0000 Married  Caucasian Fe here for annual exam.  Periods just returned after Nexplanon removal now. Had 42 days of spotting and period like bleeding. Last period 07/02/16, normal. Plans to try for pregnancy, off all medication now except Prozac and plans to wean down to lower dose. Taking prenatal vitamins and working on eating better and weight loss prior to conception. Continues with psychiatry for medication management. No health issues today.  Patient's last menstrual period was 07/02/2016 (exact date).          Sexually active: Yes.    The current method of family planning is none.    Exercising: Yes.    cross fit Smoker:  no  Health Maintenance: Pap:  06-25-12 neg, 07-21-15 ASCUS-H HPV HR+ History of Abnormal Pap: yes LGSIL colpo 8/17 MMG:  none Self Breast exams: done occ Colonoscopy:  none BMD:   none TDaP:  2017 Shingles: no Pneumonia: no Hep C and HIV: maybe had HIV labwork yrs ago Labs: none   reports that she has never smoked. She has never used smokeless tobacco. She reports that she does not drink alcohol or use drugs.  Past Medical History:  Diagnosis Date  . Abnormal Pap smear of cervix 07/21/2015   ASCUS cant rule out high grade  . Allergy   . Anxiety   . Depression   . IBS (irritable bowel syndrome)   . Inverse psoriasis     Past Surgical History:  Procedure Laterality Date  . COLPOSCOPY  08/2015   LGSIL  . nexplanon insertion     10-22-13, removed 05-17-16    Current Outpatient Prescriptions  Medication Sig Dispense Refill  . FLUoxetine (PROZAC) 40 MG capsule     . hydrocortisone 2.5 % ointment      No current facility-administered medications for this visit.     Family History  Problem Relation Age of Onset  . Cancer Father        skin  . Hypertension Father   . Cancer Maternal Grandmother        liver  . Hypertension Paternal Grandmother   . Asthma Brother     ROS:  Pertinent items are noted in HPI.  Otherwise, a comprehensive  ROS was negative.  Exam:   BP 118/72   Pulse 64   Resp 16   Ht 5' 7.75" (1.721 m)   Wt 196 lb (88.9 kg)   LMP 07/02/2016 (Exact Date)   BMI 30.02 kg/m  Height: 5' 7.75" (172.1 cm) Ht Readings from Last 3 Encounters:  07/20/16 5' 7.75" (1.721 m)  05/30/16 5' 7.75" (1.721 m)  05/17/16 5' 7.75" (1.721 m)    General appearance: alert, cooperative and appears stated age Head: Normocephalic, without obvious abnormality, atraumatic Neck: no adenopathy, supple, symmetrical, trachea midline and thyroid normal to inspection and palpation Lungs: clear to auscultation bilaterally Breasts: normal appearance, no masses or tenderness, No nipple retraction or dimpling, No nipple discharge or bleeding, No axillary or supraclavicular adenopathy Heart: regular rate and rhythm Abdomen: soft, non-tender; no masses,  no organomegaly Extremities: extremities normal, atraumatic, no cyanosis or edema Skin: Skin color, texture, turgor normal. No rashes or lesions Lymph nodes: Cervical, supraclavicular, and axillary nodes normal. No abnormal inguinal nodes palpated Neurologic: Grossly normal   Pelvic: External genitalia:  no lesions              Urethra:  normal appearing urethra with no masses, tenderness or lesions  Bartholin's and Skene's: normal                 Vagina: normal appearing vagina with normal color and discharge, no lesions              Cervix: no bleeding following Pap, no cervical motion tenderness and no lesions              Pap taken: Yes.   Bimanual Exam:  Uterus:  normal size, contour, position, consistency, mobility, non-tender              Adnexa: normal adnexa and no mass, fullness, tenderness               Rectovaginal: Confirms               Anus:  normal   Chaperone present: yes  A:  Well Woman with normal exam  Contraception NFP with  Ovulation kit use, planning pregnancy this year. Rubella immune  Follow up of abnormal pap of ASCUS-H, with LSIL on  colpo  Anxiety/depression with MD management working well  P:   Reviewed health and wellness pertinent to exam  Patient will advise if positive UPT and come for confirmation, stressed prenatal vitamin use and healthy weight prior to conception a better choice  Continue follow up with MD as indicated  Pap smear: yes if negative repeat in one year if not per results  counseled on breast self exam, adequate intake of calcium and vitamin D, diet and exercise  return annually or prn  An After Visit Summary was printed and given to the patient.

## 2016-07-20 NOTE — Patient Instructions (Signed)

## 2016-07-24 LAB — CYTOLOGY - PAP
Diagnosis: NEGATIVE
HPV (WINDOPATH): NOT DETECTED

## 2016-07-25 ENCOUNTER — Telehealth: Payer: Self-pay

## 2016-07-25 NOTE — Telephone Encounter (Signed)
Spoke with patient. Results given. Patient is agreeable. 08 recall placed.  Notes recorded by Verner CholLeonard, Deborah S, CNM on 07/24/2016 at 6:17 PM EDT Notify patient that her pap smear is negative, HPVHR not detected, previous pap ASCUS-H Needs repeat one year 08 previous LSIL on colpo

## 2016-09-11 DIAGNOSIS — F3181 Bipolar II disorder: Secondary | ICD-10-CM | POA: Diagnosis not present

## 2016-10-04 DIAGNOSIS — S66812A Strain of other specified muscles, fascia and tendons at wrist and hand level, left hand, initial encounter: Secondary | ICD-10-CM | POA: Diagnosis not present

## 2016-10-04 DIAGNOSIS — M25532 Pain in left wrist: Secondary | ICD-10-CM | POA: Diagnosis not present

## 2016-10-04 DIAGNOSIS — F3181 Bipolar II disorder: Secondary | ICD-10-CM | POA: Diagnosis not present

## 2016-10-05 ENCOUNTER — Ambulatory Visit (INDEPENDENT_AMBULATORY_CARE_PROVIDER_SITE_OTHER): Payer: BLUE CROSS/BLUE SHIELD | Admitting: Certified Nurse Midwife

## 2016-10-05 ENCOUNTER — Encounter: Payer: Self-pay | Admitting: Certified Nurse Midwife

## 2016-10-05 VITALS — BP 120/72 | HR 70 | Resp 16 | Ht 67.75 in | Wt 198.0 lb

## 2016-10-05 DIAGNOSIS — N912 Amenorrhea, unspecified: Secondary | ICD-10-CM | POA: Diagnosis not present

## 2016-10-05 DIAGNOSIS — Z3201 Encounter for pregnancy test, result positive: Secondary | ICD-10-CM | POA: Diagnosis not present

## 2016-10-05 LAB — POCT URINE PREGNANCY: PREG TEST UR: POSITIVE — AB

## 2016-10-05 NOTE — Patient Instructions (Signed)

## 2016-10-05 NOTE — Progress Notes (Addendum)
26 y.o.marital status white female g0p0 presents with amenorrhea with + UPT on 10/04/16. LMP 08/30/16. Planned pregnancy. Complaining of breast tenderness, fatigue, and some nausea, no vomiting. Eating without difficulty. Very excited!!. Denies spotting, bleeding or cramping. Medications she is taking are: Prozac, Prenatal vitamins with folic acid, Zantac and eczema cream prn. Patient has not consumed alcohol since + UPT. Spouse supportive and with patient today. Patient has not completed decreased her Prozac dose from 40 mg yet, but now with pregnancy confirmation will reduce down per psychiatrist to 20 mg and will call for advise how to do this safely. Discussed Zantac not recommended for long term use and discussed trying small frequent meals and papaya enzyme tablets with meals. Will advise if issues with use.  Review of Systems  Constitutional: Negative.  Negative for malaise/fatigue.  Respiratory: Negative.   Cardiovascular: Negative.   Gastrointestinal: Positive for nausea. Negative for vomiting.  Genitourinary: Negative.   Neurological: Negative.   Psychiatric/Behavioral: Negative.     O: HPI pertinent to above. Healthy WDWN female Affect: normal, orientation x 3  Last Aex: 07/20/16 Pap smear: Negative 07/20/16            Rubella screen:positive immunity on 05/30/17  A: Amenorrhea with positive UPT  5 wk  per LNMP with EDC 06/06/16. Planned pregnancy Depression on Prozac and will reduce dose with supervision of Psychiatrist   P: Reviewed with patient importance of prenatal care during pregnancy. Given OB provider list. Reviewed nutrition importance of pregnancy and selecting from all food groups and making sure to have adequate protein intake daily. Discussed avoiding raw or exotic fish, soft cheeses due to risk of bacteria . Discussed concerns with FAS with alcohol use in pregnancy. Discussed increase of IUGR and SIDS with smoking use or second smoke. Reviewed warning signs of early  pregnancy and need to advise if occurs. Should call with any bleeding. Discussed comfort measures for early pregnancy changes. Offered viability PUS here prior to initiating prenatal care. Patient and spouse would like to have PUS here. She will be called with insurance information and scheduled at approximately 6 + weeks. Questions addressed at length.  Labs:none needed  Rv prn   32 minutes of time spent with patient in face to face counseling regarding pregnancy and prenatal care

## 2016-10-06 ENCOUNTER — Encounter: Payer: Self-pay | Admitting: Certified Nurse Midwife

## 2016-10-09 ENCOUNTER — Ambulatory Visit (INDEPENDENT_AMBULATORY_CARE_PROVIDER_SITE_OTHER): Payer: BLUE CROSS/BLUE SHIELD | Admitting: Obstetrics and Gynecology

## 2016-10-09 ENCOUNTER — Encounter: Payer: Self-pay | Admitting: Obstetrics and Gynecology

## 2016-10-09 ENCOUNTER — Encounter: Payer: Self-pay | Admitting: Obstetrics & Gynecology

## 2016-10-09 ENCOUNTER — Telehealth: Payer: Self-pay | Admitting: Certified Nurse Midwife

## 2016-10-09 VITALS — BP 118/60 | Ht 67.75 in | Wt 196.0 lb

## 2016-10-09 DIAGNOSIS — O209 Hemorrhage in early pregnancy, unspecified: Secondary | ICD-10-CM

## 2016-10-09 NOTE — Telephone Encounter (Signed)
Spoke with patient. Reports she is [redacted] weeks pregnant, noted "dime size amount of dark brown blood" 1 hour ago. Reports "stomach problems" for the last 3 days r/t "constipation". Denies any current pain, describes "stomach problems" as cramping. Denies N/V/fever/chills.  Recommended OV for further evaluation, advised [name], CNM is out of the office today, patient request to schedule with Melanie Oneill. Advised patient need to review schedule with Melanie Oneill and return call, patient is agreeable.

## 2016-10-09 NOTE — Telephone Encounter (Signed)
Patient called states she found out last week she is pregnant.  She is having so spotting this morning and would like to speak to a nurse.

## 2016-10-09 NOTE — Progress Notes (Signed)
GYNECOLOGY  VISIT   HPI: 26 y.o.   Married  Caucasian  female   G0P0000 with Patient's last menstrual period was 08/30/2016 (exact date).   here for spotting with confirmed pregnancy. She is also complaining of constipation.  Had brown spotting which has stopped.   Some lower abdominal discomfort.   Ate prunes to help with constipation.   Had intercourse the night before the spotting.   GYNECOLOGIC HISTORY: Patient's last menstrual period was 08/30/2016 (exact date). Contraception: None Menopausal hormone therapy:  n/a Last mammogram:  n/a Last pap smear: 07-20-16 Neg:Neg HR HPV, 07-21-15 ASCUS-H:Pos HR HPV--Colpo revealed LGSIL with neg.ECC        OB History    Gravida Para Term Preterm AB Living   0   0         SAB TAB Ectopic Multiple Live Births                     Patient Active Problem List   Diagnosis Date Noted  . Depression, major, recurrent (HCC) 11/14/2011    Past Medical History:  Diagnosis Date  . Abnormal Pap smear of cervix 07/21/2015   ASCUS cant rule out high grade  . Allergy   . Anxiety   . Depression   . IBS (irritable bowel syndrome)   . Inverse psoriasis     Past Surgical History:  Procedure Laterality Date  . COLPOSCOPY  08/2015   LGSIL  . nexplanon insertion     10-22-13, removed 05-17-16    Current Outpatient Prescriptions  Medication Sig Dispense Refill  . Digestive Enzymes (PAPAYA ENZYME) TABS Take 1 tablet by mouth daily. Takes with last meal daily for heartburn    . FLUoxetine (PROZAC) 40 MG capsule     . hydrocortisone 2.5 % ointment Apply topically 2 (two) times daily.     No current facility-administered medications for this visit.      ALLERGIES: Wheat bran  Family History  Problem Relation Age of Onset  . Cancer Father        skin  . Hypertension Father   . Cancer Maternal Grandmother        liver  . Hypertension Paternal Grandmother   . Asthma Brother     Social History   Social History  . Marital status:  Married    Spouse name: N/A  . Number of children: N/A  . Years of education: N/A   Occupational History  . Not on file.   Social History Main Topics  . Smoking status: Never Smoker  . Smokeless tobacco: Never Used  . Alcohol use No  . Drug use: No  . Sexual activity: Yes    Partners: Male    Birth control/ protection: None   Other Topics Concern  . Not on file   Social History Narrative  . No narrative on file    ROS:  Pertinent items are noted in HPI.  PHYSICAL EXAMINATION:    BP 118/60 (BP Location: Right Arm, Patient Position: Sitting, Cuff Size: Normal)   Ht 5' 7.75" (1.721 m)   Wt 196 lb (88.9 kg)   LMP 08/30/2016 (Exact Date)   BMI 30.02 kg/m     General appearance: alert, cooperative and appears stated age  Pelvic: External genitalia:  no lesions              Urethra:  normal appearing urethra with no masses, tenderness or lesions  Bartholins and Skenes: normal                 Vagina: normal appearing vagina with normal color and discharge, no lesions              Cervix: no lesions.  Brown blood in the vagina.                 Bimanual Exam:  Uterus:  Small and nontender.               Adnexa: no mass, fullness, tenderness         Chaperone was present for exam.  ASSESSMENT  Bleeding in early pregnancy.   PLAN  We talked about bleeding in early pregnancy.  I think that best place to start with her evaluation is with quant hCGs, today and in 48 hours.  [place] to follow this at appropriate time. Will also check ABO and Rh.  Rhogam explained.    An After Visit Summary was printed and given to the patient.  __15____ minutes face to face time of which over 50% was spent in counseling.

## 2016-10-09 NOTE — Telephone Encounter (Signed)
Spoke with patient, scheduled for OV today at 1:30pm, aware will work into Dr. Rica Records schedule. Patient is agreeable to date and time.  Routing to provider for final review. Patient is agreeable to disposition. Will close encounter.   Cc: Leota Sauers, CNM

## 2016-10-10 LAB — ABO AND RH: Rh Factor: POSITIVE

## 2016-10-10 LAB — BETA HCG QUANT (REF LAB): HCG QUANT: 99 m[IU]/mL

## 2016-10-11 ENCOUNTER — Other Ambulatory Visit: Payer: Self-pay

## 2016-10-11 ENCOUNTER — Telehealth: Payer: Self-pay | Admitting: *Deleted

## 2016-10-11 ENCOUNTER — Other Ambulatory Visit: Payer: BLUE CROSS/BLUE SHIELD

## 2016-10-11 DIAGNOSIS — O209 Hemorrhage in early pregnancy, unspecified: Secondary | ICD-10-CM

## 2016-10-11 LAB — BETA HCG QUANT (REF LAB): HCG QUANT: 234 m[IU]/mL

## 2016-10-11 NOTE — Telephone Encounter (Signed)
Spoke with patient, advised as seen below per Dr. Edward Jolly. Patient scheduled for lab appointment on 10/12 at 9am. Order placed for stat beta hcg quant. Patient verbalizes understanding and is agreeable.   Patient is agreeable to disposition. Will close encounter.

## 2016-10-11 NOTE — Telephone Encounter (Signed)
-----   Message from Patton Salles, MD sent at 10/11/2016  6:07 AM EDT ----- Please inform patient of her starting hCG of 99.  This indicates she is very early in pregnancy.  She is coming in today for a repeat hCG.  Ultrasound is not recommended at this time because it is too early.  Her blood type is A positive.  She does not need any Rhogam.  Cc- Sara Chu

## 2016-10-11 NOTE — Telephone Encounter (Signed)
The patient's hCG level rose appropriately from 2 days ago.  I am recommending that we repeat it again as a STAT hCG on 10/13/16 in the office due to the bleeding she had again. Thank you.   Cc- Sara Chu

## 2016-10-11 NOTE — Telephone Encounter (Signed)
Spoke with patient, advised as seen below per Dr. Edward Jolly. Patient states she did have "heavier" bleeding last night, filled a panty liner with dark brown blood, possibly some red blood, patient unsure. Denies any bleeding today, no pain or cramping. Advised patient would update Leota Sauers, CNM and return call with any additional recommendations. Advised patient our office has not received results from today's labs, will return call once received and reviewed. Patient verbalizes understanding and is agreeable.

## 2016-10-11 NOTE — Telephone Encounter (Signed)
Labs to Dr. Edward Jolly

## 2016-10-11 NOTE — Telephone Encounter (Signed)
Okey Regal calling with stat labs from [facility].  Beta Hcg 234  Will fax results to office.  Routing to provider for review.  Leota Sauers, CNM -please advise? Also, see patient message below.  Cc: Dr. Edward Jolly

## 2016-10-13 ENCOUNTER — Other Ambulatory Visit: Payer: Self-pay | Admitting: Obstetrics and Gynecology

## 2016-10-13 ENCOUNTER — Encounter (HOSPITAL_COMMUNITY): Payer: Self-pay | Admitting: *Deleted

## 2016-10-13 ENCOUNTER — Inpatient Hospital Stay (HOSPITAL_COMMUNITY): Payer: BLUE CROSS/BLUE SHIELD

## 2016-10-13 ENCOUNTER — Other Ambulatory Visit: Payer: BLUE CROSS/BLUE SHIELD

## 2016-10-13 ENCOUNTER — Inpatient Hospital Stay (HOSPITAL_COMMUNITY)
Admission: AD | Admit: 2016-10-13 | Discharge: 2016-10-13 | Disposition: A | Discharge status: Home / Self Care | Payer: BLUE CROSS/BLUE SHIELD | Source: Ambulatory Visit | Attending: Obstetrics and Gynecology | Admitting: Obstetrics and Gynecology

## 2016-10-13 ENCOUNTER — Telehealth: Payer: Self-pay | Admitting: *Deleted

## 2016-10-13 DIAGNOSIS — O209 Hemorrhage in early pregnancy, unspecified: Secondary | ICD-10-CM | POA: Diagnosis not present

## 2016-10-13 DIAGNOSIS — O200 Threatened abortion: Secondary | ICD-10-CM | POA: Diagnosis not present

## 2016-10-13 DIAGNOSIS — O99341 Other mental disorders complicating pregnancy, first trimester: Secondary | ICD-10-CM | POA: Diagnosis not present

## 2016-10-13 DIAGNOSIS — Z825 Family history of asthma and other chronic lower respiratory diseases: Secondary | ICD-10-CM | POA: Insufficient documentation

## 2016-10-13 DIAGNOSIS — Z3A00 Weeks of gestation of pregnancy not specified: Secondary | ICD-10-CM | POA: Insufficient documentation

## 2016-10-13 DIAGNOSIS — F419 Anxiety disorder, unspecified: Secondary | ICD-10-CM | POA: Diagnosis not present

## 2016-10-13 DIAGNOSIS — O3680X0 Pregnancy with inconclusive fetal viability, not applicable or unspecified: Secondary | ICD-10-CM

## 2016-10-13 DIAGNOSIS — N939 Abnormal uterine and vaginal bleeding, unspecified: Secondary | ICD-10-CM | POA: Diagnosis not present

## 2016-10-13 DIAGNOSIS — O0281 Inappropriate change in quantitative human chorionic gonadotropin (hCG) in early pregnancy: Secondary | ICD-10-CM | POA: Diagnosis not present

## 2016-10-13 DIAGNOSIS — Z8249 Family history of ischemic heart disease and other diseases of the circulatory system: Secondary | ICD-10-CM | POA: Diagnosis not present

## 2016-10-13 DIAGNOSIS — Z3A01 Less than 8 weeks gestation of pregnancy: Secondary | ICD-10-CM | POA: Diagnosis not present

## 2016-10-13 DIAGNOSIS — F329 Major depressive disorder, single episode, unspecified: Secondary | ICD-10-CM | POA: Insufficient documentation

## 2016-10-13 DIAGNOSIS — Z79899 Other long term (current) drug therapy: Secondary | ICD-10-CM | POA: Insufficient documentation

## 2016-10-13 LAB — CBC
HCT: 36.5 % (ref 36.0–46.0)
Hemoglobin: 12.7 g/dL (ref 12.0–15.0)
MCH: 30.2 pg (ref 26.0–34.0)
MCHC: 34.8 g/dL (ref 30.0–36.0)
MCV: 86.9 fL (ref 78.0–100.0)
PLATELETS: 263 10*3/uL (ref 150–400)
RBC: 4.2 MIL/uL (ref 3.87–5.11)
RDW: 12.4 % (ref 11.5–15.5)
WBC: 8.6 10*3/uL (ref 4.0–10.5)

## 2016-10-13 LAB — HCG, QUANTITATIVE, PREGNANCY: hCG, Beta Chain, Quant, S: 267 m[IU]/mL — ABNORMAL HIGH (ref <5)

## 2016-10-13 LAB — BETA HCG QUANT (REF LAB): HCG QUANT: 301 m[IU]/mL

## 2016-10-13 NOTE — Telephone Encounter (Signed)
-----   Message from Patton Salles, MD sent at 10/13/2016  2:34 PM EDT ----- Please contact patient and have her to go MAU now for evaluation.  Her hCG went up slightly, but not as much as we were hoping.  Her blood type is Rh positive.  She will need evaluation in MAU and pelvic US.  Dr. Oscar La is on call and aware.   Cc- Dr. Oscar La

## 2016-10-13 NOTE — MAU Note (Signed)
Urine in the lab  

## 2016-10-13 NOTE — MAU Provider Note (Signed)
History     CSN: [id number]  Arrival date and time: 10/13/16 1537   None     Chief Complaint  Patient presents with  . Vaginal Bleeding   Melanie Oneill is a 26 y.o. G1P0000 at Unknown who presents today with vaginal bleeding. She has seen her GYN this week, and they have been following HCGS. Results from the office as below.   Results for Melanie, Oneill (MRN [id number]) as of 10/13/2016 17:01  10/09/2016 14:39 hCG Quant: 99  10/11/2016 09:23 hCG Quant: 234  10/13/2016 11:07 hCG Quant: 301    Vaginal Bleeding  The patient's primary symptoms include vaginal bleeding. The patient's pertinent negatives include no pelvic pain. This is a new problem. The current episode started in the past 7 days. The problem occurs constantly. The problem has been gradually worsening. The patient is experiencing no pain. She is pregnant. Pertinent negatives include no chills, dysuria, fever, frequency, nausea, urgency or vomiting. Vaginal bleeding amount: monday it was brown, then wednesday it was more red, and required a pantiliner. Yesterday it was like a period, and then it became more like brown spottting today.  She has not been passing clots. She has not been passing tissue. Nothing aggravates the symptoms. She has tried nothing for the symptoms. Contraceptive use: Nexplanon removed in 05/2016  Her menstrual history has been regular (LMP 08/30/16 ).    Past Medical History:  Diagnosis Date  . Abnormal Pap smear of cervix 07/21/2015   ASCUS cant rule out high grade  . Allergy   . Anxiety   . Depression   . IBS (irritable bowel syndrome)   . Inverse psoriasis     Past Surgical History:  Procedure Laterality Date  . COLPOSCOPY  08/2015   LGSIL  . nexplanon insertion     10-22-13, removed 05-17-16    Family History  Problem Relation Age of Onset  . Cancer Father        skin  . Hypertension Father   . Cancer Maternal Grandmother        liver  . Hypertension  Paternal Grandmother   . Asthma Brother     Social History  Substance Use Topics  . Smoking status: Never Smoker  . Smokeless tobacco: Never Used  . Alcohol use No    Allergies:  Allergies  Allergen Reactions  . Wheat Bran Anaphylaxis    Prescriptions Prior to Admission  Medication Sig Dispense Refill Last Dose  . Digestive Enzymes (PAPAYA ENZYME) TABS Take 1 tablet by mouth daily. Takes with last meal daily for heartburn   Taking  . FLUoxetine (PROZAC) 40 MG capsule    Taking  . hydrocortisone 2.5 % ointment Apply topically 2 (two) times daily.   Taking    Review of Systems  Constitutional: Negative for chills and fever.  Gastrointestinal: Negative for nausea and vomiting.  Genitourinary: Positive for vaginal bleeding. Negative for dysuria, frequency, pelvic pain and urgency.   Physical Exam   Blood pressure 117/62, pulse 83, temperature 98.7 F (37.1 C), temperature source Oral, resp. rate 17, height  (1.727 m), weight 199 lb (90.3 kg), last menstrual period 08/30/2016, SpO2 (!) 10 %.  Physical Exam  Nursing note and vitals reviewed. Constitutional: She is oriented to person, place, and time. She appears well-developed and well-nourished. No distress.  HENT:  Head: Normocephalic.  Cardiovascular: Normal rate.   Respiratory: Effort normal.  Musculoskeletal: Normal range of motion.  Neurological: She is alert and oriented to person, place,  and time.  Skin: Skin is warm and dry.  Psychiatric: She has a normal mood and affect.     Results for orders placed or performed during the hospital encounter of 10/13/16 (from the past 24 hour(s))  CBC     Status: None   Collection Time: 10/13/16  4:08 PM  Result Value Ref Range   WBC 8.6 4.0 - 10.5 K/uL   RBC 4.20 3.87 - 5.11 MIL/uL   Hemoglobin 12.7 12.0 - 15.0 g/dL   HCT 16.1 09.6 - 04.5 %   MCV 86.9 78.0 - 100.0 fL   MCH 30.2 26.0 - 34.0 pg   MCHC 34.8 30.0 - 36.0 g/dL   RDW 40.9 81.1 - 91.4 %   Platelets 263  150 - 400 K/uL  hCG, quantitative, pregnancy     Status: Abnormal   Collection Time: 10/13/16  4:08 PM  Result Value Ref Range   hCG, Beta Chain, Quant, S 267 (H) <5 mIU/mL   US Ob Comp Less 14 Wks  Result Date: 10/13/2016 CLINICAL DATA:  Vaginal bleeding. Patient is 6 weeks 2 days pregnant by last menstrual period. EXAM: OBSTETRIC <14 WK [place] AND TRANSVAGINAL OB US TECHNIQUE: Both transabdominal and transvaginal ultrasound examinations were performed for complete evaluation of the gestation as well as the maternal uterus, adnexal regions, and pelvic cul-de-sac. Transvaginal technique was performed to assess early pregnancy. COMPARISON:  None. FINDINGS: Intrauterine gestational sac: Not seen. Yolk sac:  Not seen. Embryo:  Not seen. Maternal uterus/adnexae: Normal appearance of the ovaries. Small amount of free fluid in the pelvis. IMPRESSION: No intrauterine pregnancy identified. Symmetric appearance of the ovaries. Small amount of free fluid within the pelvis. Electronically Signed   By: Ted Mcalpine M.D.   On: 10/13/2016 18:45   US Ob Transvaginal  Result Date: 10/13/2016 CLINICAL DATA:  Vaginal bleeding. Patient is 6 weeks 2 days pregnant by last menstrual period. EXAM: OBSTETRIC <14 WK [place] AND TRANSVAGINAL OB US TECHNIQUE: Both transabdominal and transvaginal ultrasound examinations were performed for complete evaluation of the gestation as well as the maternal uterus, adnexal regions, and pelvic cul-de-sac. Transvaginal technique was performed to assess early pregnancy. COMPARISON:  None. FINDINGS: Intrauterine gestational sac: Not seen. Yolk sac:  Not seen. Embryo:  Not seen. Maternal uterus/adnexae: Normal appearance of the ovaries. Small amount of free fluid in the pelvis. IMPRESSION: No intrauterine pregnancy identified. Symmetric appearance of the ovaries. Small amount of free fluid within the pelvis. Electronically Signed   By: Ted Mcalpine M.D.   On: 10/13/2016 18:45   MAU  Course  Procedures  MDM 1750: DW Dr. Oscar La, ok for DC home. Will repeat HCG in 48 hours.   Assessment and Plan   1. Pregnancy, location unknown   2. Inappropriate change in quantitative hCG in early pregnancy    DC home Comfort measures reviewed  1st Trimester precautions  Bleeding precautions Ectopic precautions RX: none  Return to MAU as needed FU in MAU in 48 hours for repeat HCG   Follow-up Information    THE [facility] MATERNITY ADMISSIONS Follow up.   Why:  Return Sunday afternoon around 4:00 for repeat blood work  Contact information: [address] [id number] mc [place] [address] [phone]           Thressa Sheller 10/13/2016, 5:01 PM

## 2016-10-13 NOTE — MAU Note (Signed)
Started spotting on Monday, brown. Went to dr, had exam, was suspected to be from recent intercourse.  Bleeding has continued, small amts..  Blood tests in office.10/10: 234, 10/12: 301.  Brown spotting. No pain

## 2016-10-13 NOTE — MAU Note (Signed)
Pt is a G1P0 sent from the office for suspected ectopic pregnancy.  NO VB or cramping today.

## 2016-10-13 NOTE — Telephone Encounter (Signed)
Call to patient to review results. Left message on cell and work number. No details left.

## 2016-10-13 NOTE — Discharge Instructions (Signed)
Ectopic Pregnancy An ectopic pregnancy is when the fertilized egg attaches (implants) outside the uterus. Most ectopic pregnancies occur in one of the tubes where eggs travel from the ovary to the uterus (fallopian tubes), but the implanting can occur in other locations. In rare cases, ectopic pregnancies occur on the ovary, intestine, pelvis, abdomen, or cervix. In an ectopic pregnancy, the fertilized egg does not have the ability to develop into a normal, healthy baby. A ruptured ectopic pregnancy is one in which tearing or bursting of a fallopian tube causes internal bleeding. Often, there is intense lower abdominal pain, and vaginal bleeding sometimes occurs. Having an ectopic pregnancy can be life-threatening. If this dangerous condition is not treated, it can lead to blood loss, shock, or even death. What are the causes? The most common cause of this condition is damage to one of the fallopian tubes. A fallopian tube may be narrowed or blocked, and that keeps the fertilized egg from reaching the uterus. What increases the risk? This condition is more likely to develop in women of childbearing age who have different levels of risk. The levels of risk can be divided into three categories. High risk  You have gone through infertility treatment.  You have had an ectopic pregnancy before.  You have had surgery on the fallopian tubes, or another surgical procedure, such as an abortion.  You have had surgery to have the fallopian tubes tied (tubal ligation).  You have problems or diseases of the fallopian tubes.  You have been exposed to diethylstilbestrol (DES). This medicine was used until 1971, and it had effects on babies whose mothers took the medicine.  You become pregnant while using an IUD (intrauterine device) for birth control. Moderate risk  You have a history of infertility.  You have had an STI (sexually transmitted infection).  You have a history of pelvic inflammatory  disease (PID).  You have scarring from endometriosis.  You have multiple sexual partners.  You smoke. Low risk  You have had pelvic surgery.  You use vaginal douches.  You became sexually active before age 18. What are the signs or symptoms? Common symptoms of this condition include normal pregnancy symptoms, such as missing a period, nausea, tiredness, abdominal pain, breast tenderness, and bleeding. However, ectopic pregnancy will have additional symptoms, such as:  Pain with intercourse.  Irregular vaginal bleeding or spotting.  Cramping or pain on one side or in the lower abdomen.  Fast heartbeat, low blood pressure, and sweating.  Passing out while having a bowel movement.  Symptoms of a ruptured ectopic pregnancy and internal bleeding may include:  Sudden, severe pain in the abdomen and pelvis.  Dizziness, weakness, light-headedness, or fainting.  Pain in the shoulder or neck area.  How is this diagnosed? This condition is diagnosed by:  A pelvic exam to locate pain or a mass in the abdomen.  A pregnancy test. This blood test checks for the presence as well as the specific level of pregnancy hormone in the bloodstream.  Ultrasound. This is performed if a pregnancy test is positive. In this test, a probe is inserted into the vagina. The probe will detect a fetus, possibly in a location other than the uterus.  Taking a sample of uterus tissue (dilation and curettage, or D&C).  Surgery to perform a visual exam of the inside of the abdomen using a thin, lighted tube that has a tiny camera on the end (laparoscope).  Culdocentesis. This procedure involves inserting a needle at the top   of the vagina, behind the uterus. If blood is present in this area, it may indicate that a fallopian tube is torn.  How is this treated? This condition is treated with medicine or surgery. Medicine  An injection of a medicine (methotrexate) may be given to cause the pregnancy tissue  to be absorbed. This medicine may save your fallopian tube. It may be given if: ? The diagnosis is made early, with no signs of active bleeding. ? The fallopian tube has not ruptured. ? You are considered to be a good candidate for the medicine. Usually, pregnancy hormone blood levels are checked after methotrexate treatment. This is to be sure that the medicine is effective. It may take 4-6 weeks for the pregnancy to be absorbed. Most pregnancies will be absorbed by 3 weeks. Surgery  A laparoscope may be used to remove the pregnancy tissue.  If severe internal bleeding occurs, a larger cut (incision) may be made in the lower abdomen (laparotomy) to remove the fetus and placenta. This is done to stop the bleeding.  Part or all of the fallopian tube may be removed (salpingectomy) along with the fetus and placenta. The fallopian tube may also be repaired during the surgery.  In very rare circumstances, removal of the uterus (hysterectomy) may be required.  After surgery, pregnancy hormone testing may be done to be sure that there is no pregnancy tissue left. Whether your treatment is medicine or surgery, you may receive a Rho (D) immune globulin shot to prevent problems with any future pregnancy. This shot may be given if:  You are Rh-negative and the baby's father is Rh-positive.  You are Rh-negative and you do not know the Rh type of the baby's father.  Follow these instructions at home:  Rest and limit your activity after the procedure for as long as told by your health care provider.  Until your health care provider says that it is safe: ? Do not lift anything that is heavier than 10 lb (4.5 kg), or the limit that your health care provider tells you. ? Avoid physical exercise and any movement that requires effort (is strenuous).  To help prevent constipation: ? Eat a healthy diet that includes fruits, vegetables, and whole grains. ? Drink 6-8 glasses of water per day. Get help  right away if:  You develop worsening pain that is not relieved by medicine.  You have: ? A fever or chills. ? Vaginal bleeding. ? Redness and swelling at the incision site. ? Nausea and vomiting.  You feel dizzy or weak.  You feel light-headed or you faint. This information is not intended to replace advice given to you by your health care provider. Make sure you discuss any questions you have with your health care provider. Document Released: 01/27/2004 Document Revised: 08/18/2015 Document Reviewed: 07/21/2015 Elsevier Interactive Patient Education  2018 [employer]  

## 2016-10-13 NOTE — Telephone Encounter (Signed)
Call from patient. Reviewed lab results and recommendations for additional evaluation at MAU. Patient is agreeable to proceed to hospital. Instructions given to MAU. Currently is not bleeding and denies pain. Has not had bleeding since episode of bleeding "like a period" yesterday from 9-11am. She is 15 minutes away and agrees to go now to hospital.  Verbal report to Dr Oscar La. Call to MAU. Valentino Saxon of patient's status and orders for evaluation and ultrasound.  Encounter closed.

## 2016-10-15 ENCOUNTER — Inpatient Hospital Stay (HOSPITAL_COMMUNITY)
Admission: AD | Admit: 2016-10-15 | Discharge: 2016-10-15 | Disposition: A | Discharge status: Home / Self Care | Payer: BLUE CROSS/BLUE SHIELD | Source: Ambulatory Visit | Attending: Obstetrics and Gynecology | Admitting: Obstetrics and Gynecology

## 2016-10-15 DIAGNOSIS — O26891 Other specified pregnancy related conditions, first trimester: Secondary | ICD-10-CM | POA: Insufficient documentation

## 2016-10-15 DIAGNOSIS — O3680X0 Pregnancy with inconclusive fetal viability, not applicable or unspecified: Secondary | ICD-10-CM

## 2016-10-15 DIAGNOSIS — Z3A00 Weeks of gestation of pregnancy not specified: Secondary | ICD-10-CM | POA: Insufficient documentation

## 2016-10-15 LAB — HCG, QUANTITATIVE, PREGNANCY: hCG, Beta Chain, Quant, S: 500 m[IU]/mL — ABNORMAL HIGH (ref <5)

## 2016-10-15 NOTE — MAU Note (Signed)
Patient here for f/u HCG, patient denies vaginal bleeding, had a little bit of cramping yesterday none today.

## 2016-10-15 NOTE — MAU Provider Note (Signed)
S: 26 y.o. G1P0000  by LMP presents to MAU for repeat hcg.  She denies abdominal pain or vaginal bleeding today.    HCG results history from the office: 10/09/16 99  10/11/16 234  10/13/16 301  In MAU:  10/13/16 267  [place] on 10/13/16 showed no IUP and no ectopic pregnancy  HPI  O: BP (!) 107/58   Pulse 86   Temp 98.6 F (37 C)   Resp 16   LMP 08/30/2016   VS reviewed, nursing note reviewed,  Constitutional: well developed, well nourished, no distress HEENT: normocephalic CV: normal rate Pulm/chest wall: normal effort Abdomen: soft Neuro: alert and oriented x 3 Skin: warm, dry Psych: affect normal  Results for orders placed or performed during the hospital encounter of 10/15/16 (from the past 24 hour(s))  hCG, quantitative, pregnancy     Status: Abnormal   Collection Time: 10/15/16  4:00 PM  Result Value Ref Range   hCG, Beta Chain, Quant, S 500 (H) <5 mIU/mL    A/Positive/-- (10/08 1439)  MDM: Ordered labs/reviewed results.  Quant hcg rose appropriately today, >60%.  Consult Dr Oscar La with assessment and lab findings.  Discussed results with pt.  Pt to return to MAU in 3 days during office hours on Wednesday morning for repeat hcg.  Ectopic precautions given and pt to return to MAU sooner if s/sx of ectopic, as ruptured ectopic can be life threatening.  Pt stable at time of discharge.  A: 1. Pregnancy of unknown anatomic location     P: D/C home with ectopic/bleeding precautions F/U with labs on Tuesday pm or Wednesday am, pt preference Return to MAU as needed for emergencies  LEFTWICH-KIRBY, Melanie Oneill, CNM 2:18 PM

## 2016-10-17 ENCOUNTER — Inpatient Hospital Stay (HOSPITAL_COMMUNITY): Payer: BLUE CROSS/BLUE SHIELD

## 2016-10-17 ENCOUNTER — Inpatient Hospital Stay (HOSPITAL_COMMUNITY)
Admission: AD | Admit: 2016-10-17 | Discharge: 2016-10-17 | Disposition: A | Discharge status: Home / Self Care | Payer: BLUE CROSS/BLUE SHIELD | Source: Ambulatory Visit | Attending: Obstetrics & Gynecology | Admitting: Obstetrics & Gynecology

## 2016-10-17 ENCOUNTER — Other Ambulatory Visit: Payer: Self-pay | Admitting: Obstetrics & Gynecology

## 2016-10-17 ENCOUNTER — Encounter (HOSPITAL_COMMUNITY): Payer: Self-pay | Admitting: *Deleted

## 2016-10-17 DIAGNOSIS — O209 Hemorrhage in early pregnancy, unspecified: Secondary | ICD-10-CM

## 2016-10-17 DIAGNOSIS — O0281 Inappropriate change in quantitative human chorionic gonadotropin (hCG) in early pregnancy: Secondary | ICD-10-CM | POA: Diagnosis not present

## 2016-10-17 DIAGNOSIS — Z3A00 Weeks of gestation of pregnancy not specified: Secondary | ICD-10-CM | POA: Diagnosis not present

## 2016-10-17 DIAGNOSIS — O00101 Right tubal pregnancy without intrauterine pregnancy: Secondary | ICD-10-CM | POA: Insufficient documentation

## 2016-10-17 DIAGNOSIS — O009 Unspecified ectopic pregnancy without intrauterine pregnancy: Secondary | ICD-10-CM | POA: Diagnosis not present

## 2016-10-17 LAB — COMPREHENSIVE METABOLIC PANEL
ALBUMIN: 4 g/dL (ref 3.5–5.0)
ALK PHOS: 67 U/L (ref 38–126)
ALT: 23 U/L (ref 14–54)
AST: 23 U/L (ref 15–41)
Anion gap: 8 (ref 5–15)
BILIRUBIN TOTAL: 0.3 mg/dL (ref 0.3–1.2)
BUN: 10 mg/dL (ref 6–20)
CALCIUM: 9.6 mg/dL (ref 8.9–10.3)
CO2: 25 mmol/L (ref 22–32)
Chloride: 103 mmol/L (ref 101–111)
Creatinine, Ser: 0.62 mg/dL (ref 0.44–1.00)
GFR calc Af Amer: >60 mL/min (ref >60)
GFR calc non Af Amer: >60 mL/min (ref >60)
GLUCOSE: 95 mg/dL (ref 65–99)
POTASSIUM: 3.9 mmol/L (ref 3.5–5.1)
Sodium: 136 mmol/L (ref 135–145)
TOTAL PROTEIN: 7.4 g/dL (ref 6.5–8.1)

## 2016-10-17 LAB — CBC WITH DIFFERENTIAL/PLATELET
BASOS ABS: 0 10*3/uL (ref 0.0–0.1)
BASOS PCT: 0 %
Eosinophils Absolute: 0.1 10*3/uL (ref 0.0–0.7)
Eosinophils Relative: 2 %
HEMATOCRIT: 35.5 % — AB (ref 36.0–46.0)
HEMOGLOBIN: 12.5 g/dL (ref 12.0–15.0)
Lymphocytes Relative: 34 %
Lymphs Abs: 2.7 10*3/uL (ref 0.7–4.0)
MCH: 30.3 pg (ref 26.0–34.0)
MCHC: 35.2 g/dL (ref 30.0–36.0)
MCV: 86.2 fL (ref 78.0–100.0)
Monocytes Absolute: 0.3 10*3/uL (ref 0.1–1.0)
Monocytes Relative: 4 %
NEUTROS ABS: 4.8 10*3/uL (ref 1.7–7.7)
NEUTROS PCT: 60 %
Platelets: 256 10*3/uL (ref 150–400)
RBC: 4.12 MIL/uL (ref 3.87–5.11)
RDW: 12.3 % (ref 11.5–15.5)
WBC: 8 10*3/uL (ref 4.0–10.5)

## 2016-10-17 LAB — HCG, QUANTITATIVE, PREGNANCY: HCG, BETA CHAIN, QUANT, S: 876 m[IU]/mL — AB (ref <5)

## 2016-10-17 MED ORDER — METHOTREXATE INJECTION FOR WOMEN'S HOSPITAL
50.0000 mg/m2 | Freq: Once | INTRAMUSCULAR | Status: AC
  Administered 2016-10-17: 105 mg via INTRAMUSCULAR
  Filled 2016-10-17: qty 2.1

## 2016-10-17 NOTE — Discharge Instructions (Signed)
Ectopic Pregnancy An ectopic pregnancy is when the fertilized egg attaches (implants) outside the uterus. Most ectopic pregnancies occur in one of the tubes where eggs travel from the ovary to the uterus (fallopian tubes), but the implanting can occur in other locations. In rare cases, ectopic pregnancies occur on the ovary, intestine, pelvis, abdomen, or cervix. In an ectopic pregnancy, the fertilized egg does not have the ability to develop into a normal, healthy baby. A ruptured ectopic pregnancy is one in which tearing or bursting of a fallopian tube causes internal bleeding. Often, there is intense lower abdominal pain, and vaginal bleeding sometimes occurs. Having an ectopic pregnancy can be life-threatening. If this dangerous condition is not treated, it can lead to blood loss, shock, or even death. What are the causes? The most common cause of this condition is damage to one of the fallopian tubes. A fallopian tube may be narrowed or blocked, and that keeps the fertilized egg from reaching the uterus. What increases the risk? This condition is more likely to develop in women of childbearing age who have different levels of risk. The levels of risk can be divided into three categories. High risk  You have gone through infertility treatment.  You have had an ectopic pregnancy before.  You have had surgery on the fallopian tubes, or another surgical procedure, such as an abortion.  You have had surgery to have the fallopian tubes tied (tubal ligation).  You have problems or diseases of the fallopian tubes.  You have been exposed to diethylstilbestrol (DES). This medicine was used until 1971, and it had effects on babies whose mothers took the medicine.  You become pregnant while using an IUD (intrauterine device) for birth control. Moderate risk  You have a history of infertility.  You have had an STI (sexually transmitted infection).  You have a history of pelvic inflammatory  disease (PID).  You have scarring from endometriosis.  You have multiple sexual partners.  You smoke. Low risk  You have had pelvic surgery.  You use vaginal douches.  You became sexually active before age 18. What are the signs or symptoms? Common symptoms of this condition include normal pregnancy symptoms, such as missing a period, nausea, tiredness, abdominal pain, breast tenderness, and bleeding. However, ectopic pregnancy will have additional symptoms, such as:  Pain with intercourse.  Irregular vaginal bleeding or spotting.  Cramping or pain on one side or in the lower abdomen.  Fast heartbeat, low blood pressure, and sweating.  Passing out while having a bowel movement.  Symptoms of a ruptured ectopic pregnancy and internal bleeding may include:  Sudden, severe pain in the abdomen and pelvis.  Dizziness, weakness, light-headedness, or fainting.  Pain in the shoulder or neck area.  How is this diagnosed? This condition is diagnosed by:  A pelvic exam to locate pain or a mass in the abdomen.  A pregnancy test. This blood test checks for the presence as well as the specific level of pregnancy hormone in the bloodstream.  Ultrasound. This is performed if a pregnancy test is positive. In this test, a probe is inserted into the vagina. The probe will detect a fetus, possibly in a location other than the uterus.  Taking a sample of uterus tissue (dilation and curettage, or D&C).  Surgery to perform a visual exam of the inside of the abdomen using a thin, lighted tube that has a tiny camera on the end (laparoscope).  Culdocentesis. This procedure involves inserting a needle at the top   of the vagina, behind the uterus. If blood is present in this area, it may indicate that a fallopian tube is torn.  How is this treated? This condition is treated with medicine or surgery. Medicine  An injection of a medicine (methotrexate) may be given to cause the pregnancy tissue  to be absorbed. This medicine may save your fallopian tube. It may be given if: ? The diagnosis is made early, with no signs of active bleeding. ? The fallopian tube has not ruptured. ? You are considered to be a good candidate for the medicine. Usually, pregnancy hormone blood levels are checked after methotrexate treatment. This is to be sure that the medicine is effective. It may take 4-6 weeks for the pregnancy to be absorbed. Most pregnancies will be absorbed by 3 weeks. Surgery  A laparoscope may be used to remove the pregnancy tissue.  If severe internal bleeding occurs, a larger cut (incision) may be made in the lower abdomen (laparotomy) to remove the fetus and placenta. This is done to stop the bleeding.  Part or all of the fallopian tube may be removed (salpingectomy) along with the fetus and placenta. The fallopian tube may also be repaired during the surgery.  In very rare circumstances, removal of the uterus (hysterectomy) may be required.  After surgery, pregnancy hormone testing may be done to be sure that there is no pregnancy tissue left. Whether your treatment is medicine or surgery, you may receive a Rho (D) immune globulin shot to prevent problems with any future pregnancy. This shot may be given if:  You are Rh-negative and the baby's father is Rh-positive.  You are Rh-negative and you do not know the Rh type of the baby's father.  Follow these instructions at home:  Rest and limit your activity after the procedure for as long as told by your health care provider.  Until your health care provider says that it is safe: ? Do not lift anything that is heavier than 10 lb (4.5 kg), or the limit that your health care provider tells you. ? Avoid physical exercise and any movement that requires effort (is strenuous).  To help prevent constipation: ? Eat a healthy diet that includes fruits, vegetables, and whole grains. ? Drink 6-8 glasses of water per day. Get help  right away if:  You develop worsening pain that is not relieved by medicine.  You have: ? A fever or chills. ? Vaginal bleeding. ? Redness and swelling at the incision site. ? Nausea and vomiting.  You feel dizzy or weak.  You feel light-headed or you faint. This information is not intended to replace advice given to you by your health care provider. Make sure you discuss any questions you have with your health care provider. Document Released: 01/27/2004 Document Revised: 08/18/2015 Document Reviewed: 07/21/2015 Elsevier Interactive Patient Education  2018 [employer]  

## 2016-10-17 NOTE — MAU Note (Signed)
Having some slight stomach pains, started last night.  Bilateral lower abd. Had some brownish bleeding this morning.

## 2016-10-17 NOTE — MAU Provider Note (Signed)
History     CSN: [id number]  Arrival date and time: 10/17/16 1559   None     Chief Complaint  Patient presents with  . Follow-up   HPI Melanie Oneill is 26 y.o. G1P0000 6 weeks 6 days gestation presenting for repeat BHCG.  She reports mild bilateral cramping today and a small amount of brown discharge this am. She is a patient of D. Darcel Bayley, CNM/ Dr. Rica Records.   Hx of BHCs in office 10/09/2016         99 10/11/2016      234 10/13/2016      301  In MAU--BHCG results were: 10/13/2016     267  10/17/2016    876  U/S 10/12 Showed no IUP and no ectopic pregnancy    Past Medical History:  Diagnosis Date  . Abnormal Pap smear of cervix 07/21/2015   ASCUS cant rule out high grade  . Allergy   . Anxiety   . Depression   . IBS (irritable bowel syndrome)   . Inverse psoriasis     Past Surgical History:  Procedure Laterality Date  . COLPOSCOPY  08/2015   LGSIL  . nexplanon insertion     10-22-13, removed 05-17-16    Family History  Problem Relation Age of Onset  . Cancer Father        skin  . Hypertension Father   . Cancer Maternal Grandmother        liver  . Hypertension Paternal Grandmother   . Asthma Brother     Social History  Substance Use Topics  . Smoking status: Never Smoker  . Smokeless tobacco: Never Used  . Alcohol use No    Allergies:  Allergies  Allergen Reactions  . Wheat Bran Anaphylaxis    Prescriptions Prior to Admission  Medication Sig Dispense Refill Last Dose  . Digestive Enzymes (PAPAYA ENZYME) TABS Take 1 tablet by mouth daily. Takes with last meal daily for heartburn   Taking  . FLUoxetine (PROZAC) 40 MG capsule    Taking  . hydrocortisone 2.5 % ointment Apply topically 2 (two) times daily.   Taking    Review of Systems  Constitutional: Negative for fatigue and fever.  Gastrointestinal: Positive for abdominal pain (mild, intermittent cramping.).  Genitourinary: Positive for vaginal bleeding (brown discharge this am).  Negative for dysuria and vaginal pain.   Physical Exam   Blood pressure (!) 118/50, pulse 81, temperature 98.3 F (36.8 C), temperature source Oral, resp. rate 16, last menstrual period 08/30/2016, SpO2 99 %.  Physical Exam  Vitals reviewed. Constitutional: She is oriented to person, place, and time. She appears well-developed and well-nourished. No distress.  HENT:  Head: Normocephalic.  Cardiovascular: Normal rate.   Neurological: She is alert and oriented to person, place, and time.  Psychiatric: She has a normal mood and affect. Her behavior is normal. Thought content normal.     Results for orders placed or performed during the hospital encounter of 10/17/16 (from the past 24 hour(s))  hCG, quantitative, pregnancy     Status: Abnormal   Collection Time: 10/17/16  4:41 PM  Result Value Ref Range   hCG, Beta Chain, Quant, S 876 (H) <5 mIU/mL   US Ob Transvaginal  Result Date: 10/17/2016 CLINICAL DATA:  Bleeding in early pregnancy. Lack of normal elevation of beta HCG. Currently at 876. 6 weeks 6 days pregnant by last menstrual. EXAM: OBSTETRIC <14 WK ULTRASOUND TECHNIQUE: Transabdominal ultrasound was performed for evaluation of the gestation as  well as the maternal uterus and adnexal regions. COMPARISON:  10/13/2016 FINDINGS: Intrauterine gestational sac: Absent Yolk sac:  Absent Embryo:  Absent Cardiac Activity: Absent Subchorionic hemorrhage:  None visualized. Maternal uterus/adnexae: Hypervascularity in the left ovary likely represents a a corpus luteal cyst. The left ovary measures 2.8 x 2.1 x 2.4 cm. The right ovary measures 2.7 x 1.0 x 2.7 cm. Either exophytic off or adjacent to the right ovary is a hypervascular structure which measures 1.1 x 1.1 x 1.5 cm. IMPRESSION: 1. No findings to suggest gestational sac, yolk sac, or fetal pole. Considerations include early intrauterine pregnancy, missed abortion, or ectopic pregnancy. 2. Left ovarian corpus luteal cyst. 3. Right pelvic  lesion could represent exophytic corpus luteal cyst or an extraovarian hypervascular structure such as an ectopic pregnancy. Electronically Signed   By: Jeronimo Greaves M.D.   On: 10/17/2016 19:12    MAU Course  Procedures  MDM Discussed MSE, lab results with Dr. Hyacinth Meeker.  Order given to repeat U/S.   U/S reported called to Dr. Hyacinth Meeker.  She is coming in to discuss treatment options with the patient and her husband.   Assessment and Plan  A:  Inappropriate rise in BHCG levels      No findings of IUP at [redacted]w[redacted]d  P:  Care turned over to Dr. Hyacinth Meeker.   Dennison Mascot Stevon Gough 10/17/2016, 5:38 PM

## 2016-10-17 NOTE — MAU Provider Note (Signed)
26 y.o. G1P0000 MarriedCaucasianF here for follow-up of inappropriately rising HCG levels.  She was initially seen in the office on 10/05/16 by Leota Sauers, CNM, with positive UPT.  Her LMP is exact on 08/30/16.  Pt was seen again on 10/09/16 due to first trimester spotting with some lower abdominal discomfort.  HCG was 99 and blood type was A+.  Exam showed some dark spotting but no acute abdomen was noted.  Follow up HCG on 10/11/16 was 234 and on 10/13/16 was 301 and then later at MAU 267 .  CBC on 10/13/16 was normal with hemoglobin of 12.7.  Pt also has PUS done on 10/12 with no IUP or adnexal masses noted.  HCG on 10/15/16 was 500.  Pt reports she's had mild cramping throughout the last few days.  Has not taken anything for pain.    Pt returned to MAU today for repeat HCG level.  This was 876.  Due to continued spotting and cramping, repeat PUS recommended.  This showed a hypervascular structure between the uterus and the right ovary was 1.1 x 1.1 x 1.5cm.  There was also some free fluid that measured 2.5 x 2.5cm.  There was free fluid on the prior PUS measuring 1.5 x 2.7cm.  There is also a left corpus luteal cyst noted.  No IUP seen.   Pt denies SOB, lightheaded feeling, palpitations, nausea.    Patient's last menstrual period was 08/30/2016.  She should be 6 6/7 weeks by LMP.         SocH:x:  Married.  Non smoker.  Denies drug and alcohol use.    Past Medical History:  Diagnosis Date  . Abnormal Pap smear of cervix 07/21/2015   ASCUS cant rule out high grade  . Allergy   . Anxiety   . Depression   . IBS (irritable bowel syndrome)   . Inverse psoriasis     Past Surgical History:  Procedure Laterality Date  . COLPOSCOPY  08/2015   LGSIL  . nexplanon insertion     10-22-13, removed 05-17-16    Family History  Problem Relation Age of Onset  . Cancer Father        skin  . Hypertension Father   . Cancer Maternal Grandmother        liver  . Hypertension Paternal Grandmother   .  Asthma Brother     ROS:  Pertinent items are noted in HPI.  Otherwise, a comprehensive ROS was negative.  Exam:   BP (!) 118/50 (BP Location: Right Arm)   Pulse 81   Temp 98.3 F (36.8 C) (Oral)   Resp 16   LMP 08/30/2016   SpO2 99%    Ht Readings from Last 3 Encounters:  10/13/16  (1.727 m)  10/09/16 5' 7.75" (1.721 m)  10/05/16 5' 7.75" (1.721 m)    General appearance: alert, cooperative and appears stated age Head: Normocephalic, without obvious abnormality, atraumatic Lungs: clear to auscultation bilaterally Heart: regular rate and rhythm Abdomen: soft, mildly tender in RLQ; bowel sounds normal; no masses,  no organomegaly, no acute abdomen noted today Extremities: extremities normal, atraumatic, no cyanosis or edema Skin: Skin color, texture, turgor normal. No rashes or lesions Lymph nodes: Cervical, supraclavicular, and axillary nodes normal. No abnormal inguinal nodes palpated Neurologic: Grossly normal  Chaperone was present for exam.  Findings reviewed with pt.  Options for treatment with MTX vs surgical intervention discussed.  Risks with surgical intervention--bleeding, infection, possible bowel/bladder/ureter injury, infection, bleeding, loss of  tube vs MTX--failure and ruptured ectopic, bleeding, internal hemorrhage, death reviewed.  Pt understands importance of follow-up with HCGs.  She and husband spoke and decided to proceed with MTX treatment.    A:  Inappropriately rising HCG levels 1.5cm right ectopic pregnancy  P:   CMP and CBC, STAT, pending.  Will proceed with MTX single dosage--50mg /ml injection. Will repeat HCG on day 4 and 7 and will plan to do this in the office.

## 2016-10-18 ENCOUNTER — Telehealth: Payer: Self-pay | Admitting: *Deleted

## 2016-10-18 NOTE — Telephone Encounter (Signed)
Left message to call Noreene LarssonJill at [phone].   Jerene BearsMiller, Mary S, MD  P [facility] Triage Pool Cc: Armen PickupYeakley, Sarah S, RN; Suann LarryHammond, Barbara J  Sending this too all RNs to make sure pt is called on Wednesday.   This pt was seen in MAU last night. She has an ectopic pregnancy on the right. She was treated with methotrexate. She will need follow-up lab work on Friday and Monday. Orders have been placed. The Friday lab will need to be done near noon and the Monday lab I would prefer to be done by noon as well so I will have time to make management decisions for her.

## 2016-10-18 NOTE — Telephone Encounter (Signed)
Spoke with patient, advised as seen below per Dr. Hyacinth MeekerMiller. Patient reports mild cramping, taking prn OTC tylenol for pain. Reports bleeding as "just a little more than spotting".   Scheduled for lab appt on 10/19 at 11am and 10/22 at 11am.   Patient aware to return call to office for any additional questions/concerns.  Routing to provider for final review. Patient is agreeable to disposition. Will close encounter.

## 2016-10-20 ENCOUNTER — Other Ambulatory Visit (INDEPENDENT_AMBULATORY_CARE_PROVIDER_SITE_OTHER): Payer: BLUE CROSS/BLUE SHIELD

## 2016-10-20 ENCOUNTER — Telehealth: Payer: Self-pay | Admitting: *Deleted

## 2016-10-20 ENCOUNTER — Other Ambulatory Visit: Payer: Self-pay | Admitting: Obstetrics & Gynecology

## 2016-10-20 DIAGNOSIS — O00101 Right tubal pregnancy without intrauterine pregnancy: Secondary | ICD-10-CM | POA: Diagnosis not present

## 2016-10-20 NOTE — Telephone Encounter (Signed)
Called pt personally and gave results.  She is going to return on Monday for follow up lab work as planned.  Aware this value dropping so fast is not typical and I want to repeat it.  Precautions given.  Pt comfortable with plan.

## 2016-10-20 NOTE — Telephone Encounter (Signed)
Okey RegalCarol from [facility] calling with STAT lab results.  Beta HCG Quant <1  Will also fax results to office.   Routing to Dr. Hyacinth MeekerMiller

## 2016-10-23 ENCOUNTER — Inpatient Hospital Stay (HOSPITAL_COMMUNITY)
Admission: AD | Admit: 2016-10-23 | Discharge: 2016-10-23 | Disposition: A | Discharge status: Home / Self Care | Payer: BLUE CROSS/BLUE SHIELD | Source: Ambulatory Visit | Attending: Obstetrics and Gynecology | Admitting: Obstetrics and Gynecology

## 2016-10-23 ENCOUNTER — Other Ambulatory Visit: Payer: Self-pay | Admitting: Obstetrics and Gynecology

## 2016-10-23 ENCOUNTER — Other Ambulatory Visit (INDEPENDENT_AMBULATORY_CARE_PROVIDER_SITE_OTHER): Payer: BLUE CROSS/BLUE SHIELD

## 2016-10-23 ENCOUNTER — Telehealth: Payer: Self-pay | Admitting: *Deleted

## 2016-10-23 DIAGNOSIS — N930 Postcoital and contact bleeding: Secondary | ICD-10-CM | POA: Diagnosis not present

## 2016-10-23 DIAGNOSIS — O26891 Other specified pregnancy related conditions, first trimester: Secondary | ICD-10-CM | POA: Diagnosis not present

## 2016-10-23 DIAGNOSIS — F329 Major depressive disorder, single episode, unspecified: Secondary | ICD-10-CM | POA: Diagnosis not present

## 2016-10-23 DIAGNOSIS — F419 Anxiety disorder, unspecified: Secondary | ICD-10-CM | POA: Diagnosis not present

## 2016-10-23 DIAGNOSIS — Z3A00 Weeks of gestation of pregnancy not specified: Secondary | ICD-10-CM | POA: Diagnosis not present

## 2016-10-23 DIAGNOSIS — O009 Unspecified ectopic pregnancy without intrauterine pregnancy: Secondary | ICD-10-CM | POA: Diagnosis not present

## 2016-10-23 DIAGNOSIS — Z79899 Other long term (current) drug therapy: Secondary | ICD-10-CM | POA: Diagnosis not present

## 2016-10-23 DIAGNOSIS — O00101 Right tubal pregnancy without intrauterine pregnancy: Secondary | ICD-10-CM | POA: Diagnosis not present

## 2016-10-23 DIAGNOSIS — R102 Pelvic and perineal pain: Secondary | ICD-10-CM | POA: Diagnosis not present

## 2016-10-23 LAB — BETA HCG QUANT (REF LAB)
HCG QUANT: 1295 m[IU]/mL
hCG Quant: 1353 m[IU]/mL

## 2016-10-23 MED ORDER — METHOTREXATE INJECTION FOR WOMEN'S HOSPITAL
50.0000 mg/m2 | Freq: Once | INTRAMUSCULAR | Status: AC
  Administered 2016-10-23: 105 mg via INTRAMUSCULAR
  Filled 2016-10-23: qty 2.1

## 2016-10-23 NOTE — Telephone Encounter (Signed)
Melanie Oneill from [facility] calling with STAT lab.  Hcg 1353   Reviewed with nursing supervisor, routing to Melanie RuddySally Yeakley, RN.

## 2016-10-23 NOTE — Telephone Encounter (Signed)
Linda from [facility] called with corrected HCG result from 10-20-16. Corrected result 1295. Confirmation of today's result pending.  Verbal report to Dr Oscar LaJertson. Orders received.

## 2016-10-23 NOTE — Telephone Encounter (Signed)
Patients labs reviewed, her BhcG on day 4 was 1295 today was 1353. Per her conversation with Billie RuddySally Yeakley she is still having cramping, but it is better, spotting. Methotrexate has been reordered. She should not need repeat labs prior to the methotrexate. Will get f/u BhcG testing on day #4 and 7. She has been previously instructed to call with increased pain or heavy bleeding.   CC: Dr Hyacinth MeekerMiller, Dr Edward JollySilva

## 2016-10-23 NOTE — Telephone Encounter (Signed)
[facility] phlebotomy supervisor at 1415/ left message requesting repeat of both HCG levels from Friday 10-19 and today 10-23-16 for confirmation.   Verbal report given to Dr Oscar LaJertson and Edward JollySilva in office.

## 2016-10-23 NOTE — MAU Note (Signed)
Pt being followed in office post Methotrexate.  Received 1st dose on 10/16.  BHCG 1353 today, 1295 on 10/18. Office called pt and instructed her to come for 2nd dose.

## 2016-10-23 NOTE — Telephone Encounter (Signed)
Will close encounter

## 2016-10-23 NOTE — MAU Note (Signed)
Had some cramping, spotting since first injection.  Some nausea.  To return to office on Thur for repeat blood work, than Sunday here in MAU.

## 2016-10-23 NOTE — Telephone Encounter (Signed)
Call to patient. Advised of lab results and recommendation from Dr Oscar LaJertson to go to MAU for 2nd MTX injection.  Patient reports some cramping and slight pain. Not consistently on one side. Pain scale 3-4/10. Has been as high as 5/10. Using heat packs and Tylenol.  Has a little bleeding today. Instructed to proceed to MAU. Advised will schedule lab work for Thursday and Sunday. Update tomorrow.  [facility] called back to confirm repeat of HCG from today is unchanged at 1353.    Routing to provider for final review. Patient agreeable to disposition. Will close encounter.   CC: Dr Hyacinth MeekerMiller, Lorain ChildesFYI

## 2016-10-24 ENCOUNTER — Telehealth: Payer: Self-pay | Admitting: *Deleted

## 2016-10-24 ENCOUNTER — Encounter: Payer: Self-pay | Admitting: Obstetrics & Gynecology

## 2016-10-24 LAB — BETA HCG QUANT (REF LAB): hCG Quant: 1295 m[IU]/mL

## 2016-10-24 NOTE — Telephone Encounter (Signed)
Call to patient for status update and schedule lab appointment for Thursday for follow-up BHCG. Left message to call back. Can speak to any triage nurse.

## 2016-10-25 ENCOUNTER — Ambulatory Visit (HOSPITAL_COMMUNITY)
Admission: AD | Admit: 2016-10-25 | Discharge: 2016-10-25 | Disposition: A | Discharge status: Home / Self Care | Payer: BLUE CROSS/BLUE SHIELD | Source: Ambulatory Visit | Attending: Obstetrics and Gynecology | Admitting: Obstetrics and Gynecology

## 2016-10-25 ENCOUNTER — Encounter (HOSPITAL_COMMUNITY): Payer: Self-pay | Admitting: *Deleted

## 2016-10-25 ENCOUNTER — Inpatient Hospital Stay (HOSPITAL_COMMUNITY): Payer: BLUE CROSS/BLUE SHIELD

## 2016-10-25 ENCOUNTER — Encounter (HOSPITAL_COMMUNITY)
Admission: AD | Disposition: A | Discharge status: Home / Self Care | Payer: Self-pay | Source: Ambulatory Visit | Attending: Obstetrics and Gynecology

## 2016-10-25 ENCOUNTER — Inpatient Hospital Stay (HOSPITAL_COMMUNITY): Payer: BLUE CROSS/BLUE SHIELD | Admitting: Anesthesiology

## 2016-10-25 DIAGNOSIS — N930 Postcoital and contact bleeding: Secondary | ICD-10-CM | POA: Insufficient documentation

## 2016-10-25 DIAGNOSIS — F329 Major depressive disorder, single episode, unspecified: Secondary | ICD-10-CM | POA: Insufficient documentation

## 2016-10-25 DIAGNOSIS — O00101 Right tubal pregnancy without intrauterine pregnancy: Secondary | ICD-10-CM

## 2016-10-25 DIAGNOSIS — N83521 Torsion of right fallopian tube: Secondary | ICD-10-CM | POA: Diagnosis not present

## 2016-10-25 DIAGNOSIS — Z332 Encounter for elective termination of pregnancy: Secondary | ICD-10-CM | POA: Diagnosis not present

## 2016-10-25 DIAGNOSIS — Z79899 Other long term (current) drug therapy: Secondary | ICD-10-CM | POA: Insufficient documentation

## 2016-10-25 DIAGNOSIS — Z3A00 Weeks of gestation of pregnancy not specified: Secondary | ICD-10-CM | POA: Diagnosis not present

## 2016-10-25 DIAGNOSIS — O26891 Other specified pregnancy related conditions, first trimester: Secondary | ICD-10-CM | POA: Diagnosis not present

## 2016-10-25 DIAGNOSIS — R102 Pelvic and perineal pain: Secondary | ICD-10-CM | POA: Diagnosis not present

## 2016-10-25 DIAGNOSIS — R109 Unspecified abdominal pain: Secondary | ICD-10-CM

## 2016-10-25 DIAGNOSIS — O009 Unspecified ectopic pregnancy without intrauterine pregnancy: Secondary | ICD-10-CM | POA: Insufficient documentation

## 2016-10-25 DIAGNOSIS — O26899 Other specified pregnancy related conditions, unspecified trimester: Secondary | ICD-10-CM

## 2016-10-25 DIAGNOSIS — F419 Anxiety disorder, unspecified: Secondary | ICD-10-CM | POA: Insufficient documentation

## 2016-10-25 DIAGNOSIS — O00109 Unspecified tubal pregnancy without intrauterine pregnancy: Secondary | ICD-10-CM | POA: Diagnosis not present

## 2016-10-25 HISTORY — PX: DIAGNOSTIC LAPAROSCOPY WITH REMOVAL OF ECTOPIC PREGNANCY: SHX6449

## 2016-10-25 HISTORY — PX: UNILATERAL SALPINGECTOMY: SHX6160

## 2016-10-25 HISTORY — PX: CHROMOPERTUBATION: SHX6288

## 2016-10-25 HISTORY — PX: DILATION AND EVACUATION: SHX1459

## 2016-10-25 LAB — CBC
HCT: 35.4 % — ABNORMAL LOW (ref 36.0–46.0)
Hemoglobin: 12.4 g/dL (ref 12.0–15.0)
MCH: 30.5 pg (ref 26.0–34.0)
MCHC: 35 g/dL (ref 30.0–36.0)
MCV: 87 fL (ref 78.0–100.0)
Platelets: 240 10*3/uL (ref 150–400)
RBC: 4.07 MIL/uL (ref 3.87–5.11)
RDW: 12.7 % (ref 11.5–15.5)
WBC: 6.1 10*3/uL (ref 4.0–10.5)

## 2016-10-25 LAB — TYPE AND SCREEN
ABO/RH(D): A POS
Antibody Screen: NEGATIVE

## 2016-10-25 LAB — HCG, QUANTITATIVE, PREGNANCY: HCG, BETA CHAIN, QUANT, S: 1533 m[IU]/mL — AB (ref <5)

## 2016-10-25 LAB — ABO/RH: ABO/RH(D): A POS

## 2016-10-25 SURGERY — LAPAROSCOPY, WITH ECTOPIC PREGNANCY SURGICAL TREATMENT
Anesthesia: General | Site: Vagina | Laterality: Right

## 2016-10-25 MED ORDER — FENTANYL CITRATE (PF) 250 MCG/5ML IJ SOLN
INTRAMUSCULAR | Status: AC
  Filled 2016-10-25: qty 5

## 2016-10-25 MED ORDER — DEXAMETHASONE SODIUM PHOSPHATE 4 MG/ML IJ SOLN
INTRAMUSCULAR | Status: DC | PRN
  Administered 2016-10-25: 10 mg via INTRAVENOUS
  Administered 2016-10-25: 4 mg via INTRAVENOUS

## 2016-10-25 MED ORDER — LIDOCAINE HCL (CARDIAC) 20 MG/ML IV SOLN
INTRAVENOUS | Status: AC
Start: 2016-10-25 — End: 2016-10-25
  Filled 2016-10-25: qty 5

## 2016-10-25 MED ORDER — VASOPRESSIN 20 UNIT/ML IV SOLN
INTRAVENOUS | Status: DC | PRN
  Administered 2016-10-25: 6 mL via INTRAMUSCULAR

## 2016-10-25 MED ORDER — HYDROMORPHONE HCL 1 MG/ML IJ SOLN
INTRAMUSCULAR | Status: DC | PRN
  Administered 2016-10-25 (×2): 1 mg via INTRAVENOUS

## 2016-10-25 MED ORDER — ONDANSETRON HCL 4 MG/2ML IJ SOLN
INTRAMUSCULAR | Status: AC
  Filled 2016-10-25: qty 2

## 2016-10-25 MED ORDER — MEPERIDINE HCL 25 MG/ML IJ SOLN: 6.2500 mg | INTRAMUSCULAR | Status: DC | PRN

## 2016-10-25 MED ORDER — KETOROLAC TROMETHAMINE 30 MG/ML IJ SOLN
INTRAMUSCULAR | Status: AC
  Filled 2016-10-25: qty 1

## 2016-10-25 MED ORDER — MIDAZOLAM HCL 2 MG/2ML IJ SOLN
INTRAMUSCULAR | Status: DC | PRN
  Administered 2016-10-25: 2 mg via INTRAVENOUS

## 2016-10-25 MED ORDER — SOD CITRATE-CITRIC ACID 500-334 MG/5ML PO SOLN
30.0000 mL | Freq: Once | ORAL | Status: AC
  Administered 2016-10-25: 30 mL via ORAL
  Filled 2016-10-25: qty 15

## 2016-10-25 MED ORDER — MIDAZOLAM HCL 2 MG/2ML IJ SOLN
INTRAMUSCULAR | Status: AC
  Filled 2016-10-25: qty 2

## 2016-10-25 MED ORDER — OXYCODONE HCL 5 MG PO TABS: 5.0000 mg | ORAL_TABLET | Freq: Once | ORAL | Status: DC | PRN

## 2016-10-25 MED ORDER — KETOROLAC TROMETHAMINE 30 MG/ML IJ SOLN
INTRAMUSCULAR | Status: DC | PRN
  Administered 2016-10-25: 30 mg via INTRAVENOUS

## 2016-10-25 MED ORDER — BUPIVACAINE HCL (PF) 0.25 % IJ SOLN
INTRAMUSCULAR | Status: DC | PRN
  Administered 2016-10-25: 8 mL

## 2016-10-25 MED ORDER — CEFAZOLIN SODIUM-DEXTROSE 2-4 GM/100ML-% IV SOLN
2.0000 g | INTRAVENOUS | Status: AC
  Administered 2016-10-25: 2 g via INTRAVENOUS
  Filled 2016-10-25: qty 100

## 2016-10-25 MED ORDER — PROPOFOL 10 MG/ML IV BOLUS
INTRAVENOUS | Status: DC | PRN
  Administered 2016-10-25: 200 mg via INTRAVENOUS

## 2016-10-25 MED ORDER — PROMETHAZINE HCL 25 MG/ML IJ SOLN: 6.2500 mg | INTRAMUSCULAR | Status: DC | PRN

## 2016-10-25 MED ORDER — SUCCINYLCHOLINE CHLORIDE 20 MG/ML IJ SOLN
INTRAMUSCULAR | Status: DC | PRN
  Administered 2016-10-25: 120 mg via INTRAVENOUS

## 2016-10-25 MED ORDER — METHYLENE BLUE 0.5 % INJ SOLN
INTRAVENOUS | Status: DC | PRN
  Administered 2016-10-25: 2 mL

## 2016-10-25 MED ORDER — GLYCOPYRROLATE 0.2 MG/ML IJ SOLN
INTRAMUSCULAR | Status: AC
  Filled 2016-10-25: qty 1

## 2016-10-25 MED ORDER — GLYCOPYRROLATE 0.2 MG/ML IJ SOLN
INTRAMUSCULAR | Status: DC | PRN
  Administered 2016-10-25: 0.1 mg via INTRAVENOUS

## 2016-10-25 MED ORDER — SODIUM CHLORIDE 0.9 % IR SOLN
Status: DC | PRN
  Administered 2016-10-25: 3000 mL

## 2016-10-25 MED ORDER — PHENYLEPHRINE 40 MCG/ML (10ML) SYRINGE FOR IV PUSH (FOR BLOOD PRESSURE SUPPORT)
PREFILLED_SYRINGE | INTRAVENOUS | Status: AC
  Filled 2016-10-25: qty 10

## 2016-10-25 MED ORDER — PROPOFOL 10 MG/ML IV BOLUS
INTRAVENOUS | Status: AC
  Filled 2016-10-25: qty 20

## 2016-10-25 MED ORDER — IBUPROFEN 800 MG PO TABS: 800.0000 mg | ORAL_TABLET | Freq: Three times a day (TID) | ORAL | 0 refills | Status: DC | PRN

## 2016-10-25 MED ORDER — SUCCINYLCHOLINE CHLORIDE 200 MG/10ML IV SOSY
PREFILLED_SYRINGE | INTRAVENOUS | Status: AC
  Filled 2016-10-25: qty 10

## 2016-10-25 MED ORDER — KETOROLAC TROMETHAMINE 30 MG/ML IJ SOLN: 30.0000 mg | Freq: Once | INTRAMUSCULAR | Status: DC | PRN

## 2016-10-25 MED ORDER — DEXAMETHASONE SODIUM PHOSPHATE 10 MG/ML IJ SOLN
INTRAMUSCULAR | Status: AC
  Filled 2016-10-25: qty 1

## 2016-10-25 MED ORDER — FENTANYL CITRATE (PF) 100 MCG/2ML IJ SOLN
INTRAMUSCULAR | Status: DC | PRN
  Administered 2016-10-25 (×3): 50 ug via INTRAVENOUS
  Administered 2016-10-25: 100 ug via INTRAVENOUS

## 2016-10-25 MED ORDER — ROCURONIUM BROMIDE 100 MG/10ML IV SOLN
INTRAVENOUS | Status: DC | PRN
Start: 2016-10-25 — End: 2016-10-25
  Administered 2016-10-25: 10 mg via INTRAVENOUS
  Administered 2016-10-25: 30 mg via INTRAVENOUS

## 2016-10-25 MED ORDER — HYDROMORPHONE HCL 1 MG/ML IJ SOLN
INTRAMUSCULAR | Status: AC
  Filled 2016-10-25: qty 1

## 2016-10-25 MED ORDER — LIDOCAINE HCL (CARDIAC) 20 MG/ML IV SOLN
INTRAVENOUS | Status: DC | PRN
  Administered 2016-10-25: 80 mg via INTRAVENOUS

## 2016-10-25 MED ORDER — SUGAMMADEX SODIUM 200 MG/2ML IV SOLN
INTRAVENOUS | Status: AC
  Filled 2016-10-25: qty 2

## 2016-10-25 MED ORDER — HYDROMORPHONE HCL 1 MG/ML IJ SOLN
INTRAMUSCULAR | Status: AC
  Filled 2016-10-25: qty 0.5

## 2016-10-25 MED ORDER — SUGAMMADEX SODIUM 200 MG/2ML IV SOLN
INTRAVENOUS | Status: DC | PRN
  Administered 2016-10-25: 180 mg via INTRAVENOUS

## 2016-10-25 MED ORDER — PHENYLEPHRINE HCL 10 MG/ML IJ SOLN
INTRAMUSCULAR | Status: DC | PRN
  Administered 2016-10-25: 80 ug via INTRAVENOUS

## 2016-10-25 MED ORDER — SODIUM CHLORIDE 0.9 % IJ SOLN
INTRAMUSCULAR | Status: DC | PRN
  Administered 2016-10-25: 10 mL

## 2016-10-25 MED ORDER — FAMOTIDINE IN NACL 20-0.9 MG/50ML-% IV SOLN
20.0000 mg | Freq: Once | INTRAVENOUS | Status: AC
  Administered 2016-10-25: 20 mg via INTRAVENOUS
  Filled 2016-10-25: qty 50

## 2016-10-25 MED ORDER — OXYCODONE HCL 5 MG/5ML PO SOLN: 5.0000 mg | Freq: Once | ORAL | Status: DC | PRN

## 2016-10-25 MED ORDER — LACTATED RINGERS IV SOLN
INTRAVENOUS | Status: DC
  Administered 2016-10-25 (×2): via INTRAVENOUS

## 2016-10-25 MED ORDER — HYDROMORPHONE HCL 1 MG/ML IJ SOLN
0.2500 mg | INTRAMUSCULAR | Status: DC | PRN
  Administered 2016-10-25: 0.5 mg via INTRAVENOUS

## 2016-10-25 MED ORDER — ROCURONIUM BROMIDE 100 MG/10ML IV SOLN
INTRAVENOUS | Status: AC
  Filled 2016-10-25: qty 1

## 2016-10-25 MED ORDER — OXYCODONE-ACETAMINOPHEN 5-325 MG PO TABS: 1.0000 | ORAL_TABLET | ORAL | 0 refills | Status: DC | PRN

## 2016-10-25 MED ORDER — LACTATED RINGERS IV BOLUS (SEPSIS)
1000.0000 mL | Freq: Once | INTRAVENOUS | Status: AC
  Administered 2016-10-25: 1000 mL via INTRAVENOUS

## 2016-10-25 MED ORDER — METHYLENE BLUE 0.5 % INJ SOLN
INTRAVENOUS | Status: AC
  Filled 2016-10-25: qty 10

## 2016-10-25 SURGICAL SUPPLY — 49 items
ADH SKN CLS APL DERMABOND .7 (GAUZE/BANDAGES/DRESSINGS) ×4
BAG SPEC RTRVL LRG 6X4 10 (ENDOMECHANICALS)
CABLE HIGH FREQUENCY MONO STRZ (ELECTRODE) ×2 IMPLANT
CANISTER SUCT 3000ML PPV (MISCELLANEOUS) ×6 IMPLANT
CATH ROBINSON RED A/P 16FR (CATHETERS) ×4 IMPLANT
CLOTH BEACON ORANGE TIMEOUT ST (SAFETY) ×6 IMPLANT
DECANTER SPIKE VIAL GLASS SM (MISCELLANEOUS) ×6 IMPLANT
DERMABOND ADVANCED (GAUZE/BANDAGES/DRESSINGS) ×2
DERMABOND ADVANCED .7 DNX12 (GAUZE/BANDAGES/DRESSINGS) IMPLANT
DRSG OPSITE POSTOP 3X4 (GAUZE/BANDAGES/DRESSINGS) ×2 IMPLANT
FORCEPS CUTTING 33CM 5MM (CUTTING FORCEPS) IMPLANT
GLOVE BIO SURGEON STRL SZ 6.5 (GLOVE) ×10 IMPLANT
GLOVE BIO SURGEONS STRL SZ 6.5 (GLOVE) ×2
GLOVE BIOGEL PI IND STRL 7.0 (GLOVE) ×8 IMPLANT
GLOVE BIOGEL PI INDICATOR 7.0 (GLOVE) ×4
GOWN STRL REUS W/TWL LRG LVL3 (GOWN DISPOSABLE) ×12 IMPLANT
IV SET EXTENSION 150ML W/STPCK (IV SETS) ×2 IMPLANT
KIT BERKELEY 1ST TRIMESTER 3/8 (MISCELLANEOUS) ×6 IMPLANT
LIGASURE VESSEL 5MM BLUNT TIP (ELECTROSURGICAL) ×2 IMPLANT
NEEDLE INSUFFLATION 120MM (ENDOMECHANICALS) ×6 IMPLANT
NS IRRIG 1000ML POUR BTL (IV SOLUTION) ×6 IMPLANT
PACK LAPAROSCOPY BASIN (CUSTOM PROCEDURE TRAY) ×6 IMPLANT
PACK TRENDGUARD 450 HYBRID PRO (MISCELLANEOUS) IMPLANT
PACK TRENDGUARD 600 HYBRD PROC (MISCELLANEOUS) IMPLANT
PACK VAGINAL MINOR WOMEN LF (CUSTOM PROCEDURE TRAY) ×6 IMPLANT
PAD OB MATERNITY 4.3X12.25 (PERSONAL CARE ITEMS) ×6 IMPLANT
PAD PREP 24X48 CUFFED NSTRL (MISCELLANEOUS) ×6 IMPLANT
POUCH SPECIMEN RETRIEVAL 10MM (ENDOMECHANICALS) IMPLANT
PROTECTOR NERVE ULNAR (MISCELLANEOUS) ×12 IMPLANT
SCISSORS LAP 5X35 DISP (ENDOMECHANICALS) IMPLANT
SET BERKELEY SUCTION TUBING (SUCTIONS) ×6 IMPLANT
SET IRRIG TUBING LAPAROSCOPIC (IRRIGATION / IRRIGATOR) ×6 IMPLANT
SLEEVE ADV FIXATION 5X100MM (TROCAR) IMPLANT
SUT PLAIN 3 0 FS 2 27 (SUTURE) ×6 IMPLANT
SUT VICRYL 0 UR6 27IN ABS (SUTURE) IMPLANT
SYSTEM CARTER THOMASON II (TROCAR) ×2 IMPLANT
TOWEL OR 17X24 6PK STRL BLUE (TOWEL DISPOSABLE) ×14 IMPLANT
TRAY FOLEY CATH SILVER 14FR (SET/KITS/TRAYS/PACK) ×6 IMPLANT
TRENDGUARD 450 HYBRID PRO PACK (MISCELLANEOUS) ×6
TRENDGUARD 600 HYBRID PROC PK (MISCELLANEOUS)
TROCAR ADV FIXATION 5X100MM (TROCAR) ×6 IMPLANT
TROCAR XCEL NON-BLD 11X100MML (ENDOMECHANICALS) ×2 IMPLANT
TROCAR XCEL NON-BLD 5MMX100MML (ENDOMECHANICALS) ×2 IMPLANT
VACURETTE 10 RIGID CVD (CANNULA) IMPLANT
VACURETTE 6 ASPIR F TIP BERK (CANNULA) ×2 IMPLANT
VACURETTE 7MM CVD STRL WRAP (CANNULA) IMPLANT
VACURETTE 8 RIGID CVD (CANNULA) IMPLANT
VACURETTE 9 RIGID CVD (CANNULA) IMPLANT
WARMER LAPAROSCOPE (MISCELLANEOUS) ×6 IMPLANT

## 2016-10-25 NOTE — MAU Note (Signed)
Pt has had two MTX injections, one last Tuesday, one this Monday.  Had very mild cramping, became more severe last night on the R side.  Also started having dark red bleeding last night, continues today.  Was advised by MD to come to MAU.

## 2016-10-25 NOTE — Telephone Encounter (Signed)
Patient called this morning requesting to speak with the nurse. She reported more bleeding and pain during the night.

## 2016-10-25 NOTE — Anesthesia Procedure Notes (Signed)
Procedure Name: Intubation Date/Time: 10/25/2016 2:32 PM Performed by: Flossie Dibble Pre-anesthesia Checklist: Patient identified, Emergency Drugs available, Suction available, Patient being monitored and Timeout performed Patient Re-evaluated:Patient Re-evaluated prior to induction Oxygen Delivery Method: Circle system utilized Preoxygenation: Pre-oxygenation with 100% oxygen Induction Type: IV induction, Rapid sequence and Cricoid Pressure applied Laryngoscope Size: Mac and 3 Grade View: Grade I Tube type: Oral Tube size: 7.0 mm Number of attempts: 1 Airway Equipment and Method: Stylet Placement Confirmation: ETT inserted through vocal cords under direct vision,  positive ETCO2 and breath sounds checked- equal and bilateral Secured at: 21 cm Tube secured with: Tape Dental Injury: Teeth and Oropharynx as per pre-operative assessment

## 2016-10-25 NOTE — Discharge Instructions (Signed)
Salpingectomy, Care After This sheet gives you information about how to care for yourself after your procedure. Your health care provider may also give you more specific instructions. If you have problems or questions, contact your health care provider. What can I expect after the procedure? After your procedure, it is common to have:  Pain in your abdomen.  Some occasional vaginal bleeding (spotting).  Tiredness.  Follow these instructions at home: Incision care   Keep your incision area and your bandage (dressing) clean and dry.  Follow instructions from your health care provider about how to take care of your incision. Make sure you: ? Wash your hands with soap and water before you change your dressing. If soap and water are not available, use hand sanitizer. ? Change your dressing astold by your health care provider. ? Leave stitches (sutures), staples, skin glue, or adhesive strips in place. These skin closures may need to stay in place for 2 weeks or longer. If adhesive strip edges start to loosen and curl up, you may trim the loose edges. Do not remove adhesive strips completely unless your health care provider tells you to do that.  Check your incision area every day for signs of infection. Check for: ? More redness, swelling, or pain. ? More fluid or blood. ? Warmth. ? Pus or a bad smell. Activity   Do not drive or use heavy machinery while taking prescription pain medicine.  Do not drive for 24 hours if you received a medicine to help you relax (sedative).  Rest as directed by your health care provider. Ask your health care provider what activities are safe for you. You should avoid: ? Lifting anything that is heavier than 10 lb (4.5 kg) until your health care provider approves. ? Activities that require a lot of energy.  Until your health care provider approves: ? Do not douche. ? Do not use tampons. ? Do not have sexual intercourse. General instructions  Take  over-the-counter and prescription medicines only as told by your health care provider.  To prevent or treat constipation while you are taking prescription pain medicine, your health care provider may recommend that you: ? Drink enough fluid to keep your urine clear or pale yellow. ? Take over-the-counter or prescription medicines. ? Eat foods that are high in fiber, such as fresh fruits and vegetables, whole grains, and beans. ? Limit foods that are high in fat and processed sugars, such as fried and sweet foods.  Do not take baths, swim, or use a hot tub until your health care provider approves. You may take showers.  Wear compression stockings as told by your health care provider. These stockings help to prevent blood clots and reduce swelling in your legs.  Keep all follow-up visits as told by your health care provider. This is important. Contact a health care provider if:  You have: ? Pain when you urinate. ? More redness, swelling, or pain around your incision. ? More fluid or blood coming from your incision. ? Pus or a bad smell coming from your incision. ? A fever. ? Abdominal pain that gets worse or does not get better with medicine.  Your incision feels warm to the touch.  Your incision starts to break open.  You develop a rash.  You develop nausea and vomiting.  You feel light-headed. Get help right away if:  You develop pain in your chest or leg.  You develop shortness of breath.  You faint.  You have increased vaginal bleeding.  This information is not intended to replace advice given to you by your health care provider. Make sure you discuss any questions you have with your health care provider. Document Released: 03/25/2010 Document Revised: 08/18/2015 Document Reviewed: 08/19/2015 Elsevier Interactive Patient Education  2018 [employer].   Ectopic Pregnancy An ectopic pregnancy is when the fertilized egg attaches (implants) outside the uterus. Most  ectopic pregnancies occur in one of the tubes where eggs travel from the ovary to the uterus (fallopian tubes), but the implanting can occur in other locations. In rare cases, ectopic pregnancies occur on the ovary, intestine, pelvis, abdomen, or cervix. In an ectopic pregnancy, the fertilized egg does not have the ability to develop into a normal, healthy baby. A ruptured ectopic pregnancy is one in which tearing or bursting of a fallopian tube causes internal bleeding. Often, there is intense lower abdominal pain, and vaginal bleeding sometimes occurs. Having an ectopic pregnancy can be life-threatening. If this dangerous condition is not treated, it can lead to blood loss, shock, or even death. What are the causes? The most common cause of this condition is damage to one of the fallopian tubes. A fallopian tube may be narrowed or blocked, and that keeps the fertilized egg from reaching the uterus. What increases the risk? This condition is more likely to develop in women of childbearing age who have different levels of risk. The levels of risk can be divided into three categories. High risk  You have gone through infertility treatment.  You have had an ectopic pregnancy before.  You have had surgery on the fallopian tubes, or another surgical procedure, such as an abortion.  You have had surgery to have the fallopian tubes tied (tubal ligation).  You have problems or diseases of the fallopian tubes.  You have been exposed to diethylstilbestrol (DES). This medicine was used until 1971, and it had effects on babies whose mothers took the medicine.  You become pregnant while using an IUD (intrauterine device) for birth control. Moderate risk  You have a history of infertility.  You have had an STI (sexually transmitted infection).  You have a history of pelvic inflammatory disease (PID).  You have scarring from endometriosis.  You have multiple sexual partners.  You smoke. Low  risk  You have had pelvic surgery.  You use vaginal douches.  You became sexually active before age 56. What are the signs or symptoms? Common symptoms of this condition include normal pregnancy symptoms, such as missing a period, nausea, tiredness, abdominal pain, breast tenderness, and bleeding. However, ectopic pregnancy will have additional symptoms, such as:  Pain with intercourse.  Irregular vaginal bleeding or spotting.  Cramping or pain on one side or in the lower abdomen.  Fast heartbeat, low blood pressure, and sweating.  Passing out while having a bowel movement.  Symptoms of a ruptured ectopic pregnancy and internal bleeding may include:  Sudden, severe pain in the abdomen and pelvis.  Dizziness, weakness, light-headedness, or fainting.  Pain in the shoulder or neck area.  How is this diagnosed? This condition is diagnosed by:  A pelvic exam to locate pain or a mass in the abdomen.  A pregnancy test. This blood test checks for the presence as well as the specific level of pregnancy hormone in the bloodstream.  Ultrasound. This is performed if a pregnancy test is positive. In this test, a probe is inserted into the vagina. The probe will detect a fetus, possibly in a location other than the uterus.  Taking a sample of uterus tissue (dilation and curettage, or D&C).  Surgery to perform a visual exam of the inside of the abdomen using a thin, lighted tube that has a tiny camera on the end (laparoscope).  Culdocentesis. This procedure involves inserting a needle at the top of the vagina, behind the uterus. If blood is present in this area, it may indicate that a fallopian tube is torn.  How is this treated? This condition is treated with medicine or surgery. Medicine  An injection of a medicine (methotrexate) may be given to cause the pregnancy tissue to be absorbed. This medicine may save your fallopian tube. It may be given if: ? The diagnosis is made early,  with no signs of active bleeding. ? The fallopian tube has not ruptured. ? You are considered to be a good candidate for the medicine. Usually, pregnancy hormone blood levels are checked after methotrexate treatment. This is to be sure that the medicine is effective. It may take 4-6 weeks for the pregnancy to be absorbed. Most pregnancies will be absorbed by 3 weeks. Surgery  A laparoscope may be used to remove the pregnancy tissue.  If severe internal bleeding occurs, a larger cut (incision) may be made in the lower abdomen (laparotomy) to remove the fetus and placenta. This is done to stop the bleeding.  Part or all of the fallopian tube may be removed (salpingectomy) along with the fetus and placenta. The fallopian tube may also be repaired during the surgery.  In very rare circumstances, removal of the uterus (hysterectomy) may be required.  After surgery, pregnancy hormone testing may be done to be sure that there is no pregnancy tissue left. Whether your treatment is medicine or surgery, you may receive a Rho (D) immune globulin shot to prevent problems with any future pregnancy. This shot may be given if:  You are Rh-negative and the baby's father is Rh-positive.  You are Rh-negative and you do not know the Rh type of the baby's father.  Follow these instructions at home:  Rest and limit your activity after the procedure for as long as told by your health care provider.  Until your health care provider says that it is safe: ? Do not lift anything that is heavier than 10 lb (4.5 kg), or the limit that your health care provider tells you. ? Avoid physical exercise and any movement that requires effort (is strenuous).  To help prevent constipation: ? Eat a healthy diet that includes fruits, vegetables, and whole grains. ? Drink 6-8 glasses of water per day. Get help right away if:  You develop worsening pain that is not relieved by medicine.  You have: ? A fever or  chills. ? Vaginal bleeding. ? Redness and swelling at the incision site. ? Nausea and vomiting.  You feel dizzy or weak.  You feel light-headed or you faint. This information is not intended to replace advice given to you by your health care provider. Make sure you discuss any questions you have with your health care provider. Document Released: 01/27/2004 Document Revised: 08/18/2015 Document Reviewed: 07/21/2015 Elsevier Interactive Patient Education  2018 [employer].    Post Anesthesia Home Care Instructions  Activity: Get plenty of rest for the remainder of the day. A responsible individual must stay with you for 24 hours following the procedure.  For the next 24 hours, DO NOT: -Drive a car -Advertising copywriter -Drink alcoholic beverages -Take any medication unless instructed by your physician -Make any legal decisions  or sign important papers.  Meals: Start with liquid foods such as gelatin or soup. Progress to regular foods as tolerated. Avoid greasy, spicy, heavy foods. If nausea and/or vomiting occur, drink only clear liquids until the nausea and/or vomiting subsides. Call your physician if vomiting continues.  Special Instructions/Symptoms: Your throat may feel dry or sore from the anesthesia or the breathing tube placed in your throat during surgery. If this causes discomfort, gargle with warm salt water. The discomfort should disappear within 24 hours.  If you had a scopolamine patch placed behind your ear for the management of post- operative nausea and/or vomiting:  1. The medication in the patch is effective for 72 hours, after which it should be removed.  Wrap patch in a tissue and discard in the trash. Wash hands thoroughly with soap and water. 2. You may remove the patch earlier than 72 hours if you experience unpleasant side effects which may include dry mouth, dizziness or visual disturbances. 3. Avoid touching the patch. Wash your hands with soap and water  after contact with the patch.

## 2016-10-25 NOTE — Transfer of Care (Signed)
Immediate Anesthesia Transfer of Care Note  Patient: Melanie Oneill  Procedure(s) Performed: DIAGNOSTIC LAPAROSCOPY WITH REMOVAL OF ECTOPIC PREGNANCY (N/A ) DILATATION AND EVACUATION (N/A Vagina ) UNILATERAL SALPINGECTOMY (Right Abdomen) CHROMOPERTUBATION (N/A Uterus)  Patient Location: PACU  Anesthesia Type:General  Level of Consciousness: awake, alert  and oriented  Airway & Oxygen Therapy: Patient Spontanous Breathing and Patient connected to nasal cannula oxygen  Post-op Assessment: Report given to RN and Post -op Vital signs reviewed and stable  Post vital signs: Reviewed and stable  Last Vitals:  Vitals:   10/25/16 1111  BP: 112/62  Pulse: 70  Resp: 18    Last Pain:  Vitals:   10/25/16 1113  PainSc: 6       Patients Stated Pain Goal: 0 (10/25/16 1113)  Complications: No apparent anesthesia complications

## 2016-10-25 NOTE — Telephone Encounter (Signed)
Spoke with patient. Patient states that she began having sharp cramping on her right side over night. Pain was a constat 6/10. Started bleeding this morning, bright red, flow of a normal menses. Feels light headed. Pain on right side is still a 6/10. Has not taken any medication. Patient was given methotrexate on 10/17/2016. Last HCG was on 10/23/2016- 1,353. Advised will review with MD and return call. Patient is agreeable.

## 2016-10-25 NOTE — MAU Provider Note (Signed)
History     CSN: [id number]  Arrival date and time: 10/25/16 1057   First Provider Initiated Contact with Patient 10/25/16 1120      Chief Complaint  Patient presents with  . Abdominal Pain  . Vaginal Bleeding   HPI Melanie Oneill is a 26 y.o. G1P0000 at Unknown who presents with abdominal pain. Patient was recently diagnosed with ectopic pregnancy & received her 2nd dose of methotrexate on Monday (10/23). Reports increase in pain since last night. Pain is intermittent & focused in RLQ. Pain radiates to low back. Currently rates pain 3/10 but states it worsens in waves of sharp pain. Also reports some brown spotting. Denies fever/chills, syncope, or cough. Endorses some lightheadedness this morning.   OB History    Gravida Para Term Preterm AB Living   1   0         SAB TAB Ectopic Multiple Live Births                  Past Medical History:  Diagnosis Date  . Abnormal Pap smear of cervix 07/21/2015   ASCUS cant rule out high grade  . Allergy   . Anxiety   . Depression   . IBS (irritable bowel syndrome)   . Inverse psoriasis     Past Surgical History:  Procedure Laterality Date  . COLPOSCOPY  08/2015   LGSIL  . nexplanon insertion     10-22-13, removed 05-17-16  . TONSILLECTOMY    . WISDOM TOOTH EXTRACTION      Family History  Problem Relation Age of Onset  . Cancer Father        skin  . Hypertension Father   . Cancer Maternal Grandmother        liver  . Hypertension Paternal Grandmother   . Asthma Brother     Social History  Substance Use Topics  . Smoking status: Never Smoker  . Smokeless tobacco: Never Used  . Alcohol use No    Allergies:  Allergies  Allergen Reactions  . Wheat Bran Anaphylaxis    Prescriptions Prior to Admission  Medication Sig Dispense Refill Last Dose  . Digestive Enzymes (PAPAYA ENZYME) TABS Take 1 tablet by mouth daily. Takes with last meal daily for heartburn   Past Week at Unknown time  . FLUoxetine (PROZAC)  40 MG capsule Take 40 mg by mouth at bedtime.    10/16/2016 at Unknown time  . hydrocortisone 2.5 % ointment Apply topically 2 (two) times daily.   Past Month at Unknown time  . Prenatal Vit-Fe Fumarate-FA (PRENATAL MULTIVITAMIN) TABS tablet Take 1 tablet by mouth at bedtime.   10/16/2016 at Unknown time    Review of Systems  Constitutional: Negative.   Respiratory: Negative for cough and shortness of breath.   Cardiovascular: Negative for chest pain.  Gastrointestinal: Positive for abdominal pain. Negative for diarrhea, nausea and vomiting.  Genitourinary: Positive for vaginal bleeding.  Neurological: Positive for light-headedness. Negative for dizziness, syncope and headaches.   Physical Exam   Blood pressure 112/62, pulse 70, resp. rate 18, last menstrual period 08/30/2016.  Physical Exam  Nursing note and vitals reviewed. Constitutional: She is oriented to person, place, and time. She appears well-developed and well-nourished. No distress.  HENT:  Head: Normocephalic and atraumatic.  Eyes: Conjunctivae are normal. Right eye exhibits no discharge. Left eye exhibits no discharge. No scleral icterus.  Neck: Normal range of motion.  Respiratory: Effort normal. No respiratory distress.  GI: Soft. She exhibits distension.  Bowel sounds are decreased. There is tenderness in the right lower quadrant. There is no rebound and no guarding.  Neurological: She is alert and oriented to person, place, and time.  Skin: Skin is warm and dry. She is not diaphoretic.  Psychiatric: She has a normal mood and affect. Her behavior is normal. Judgment and thought content normal.    MAU Course  Procedures Results for orders placed or performed during the hospital encounter of 10/25/16 (from the past 24 hour(s))  ABO/Rh     Status: None   Collection Time: 10/25/16 11:13 AM  Result Value Ref Range   ABO/RH(D) A POS   CBC     Status: Abnormal   Collection Time: 10/25/16 11:15 AM  Result Value Ref  Range   WBC 6.1 4.0 - 10.5 K/uL   RBC 4.07 3.87 - 5.11 MIL/uL   Hemoglobin 12.4 12.0 - 15.0 g/dL   HCT 40.9 (L) 81.1 - 91.4 %   MCV 87.0 78.0 - 100.0 fL   MCH 30.5 26.0 - 34.0 pg   MCHC 35.0 30.0 - 36.0 g/dL   RDW 78.2 95.6 - 21.3 %   Platelets 240 150 - 400 K/uL  hCG, quantitative, pregnancy     Status: Abnormal   Collection Time: 10/25/16 11:15 AM  Result Value Ref Range   hCG, Beta Chain, Quant, S 1,533 (H) <5 mIU/mL  Type and screen     Status: None   Collection Time: 10/25/16 11:15 AM  Result Value Ref Range   ABO/RH(D) A POS    Antibody Screen NEG    Sample Expiration 10/28/2016    US Ob Transvaginal  Result Date: 10/25/2016 CLINICAL DATA:  Worsening pelvic pain and vomiting. Right-sided ectopic pregnancy being treated with methotrexate. EXAM: TRANSVAGINAL OB ULTRASOUND TECHNIQUE: Transvaginal ultrasound was performed for complete evaluation of the gestation as well as the maternal uterus, adnexal regions, and pelvic cul-de-sac. COMPARISON:  10/17/2016 FINDINGS: Intrauterine gestational sac: None Maternal uterus/adnexae: Normal appearance of left ovary. A small complex mass is seen right adnexa which abuts the right ovary. This measures 1.5 x 1.2 x 1.0 cm, without significant change since previous study . A tiny amount of free fluid is again seen, which appears slightly decreased since previous study. IMPRESSION: No significant change in 1.5 cm right adnexal mass which abuts the ovary, consistent with ectopic pregnancy. Tiny amount of free fluid, slightly decreased since prior exam. Critical Value/emergent results were called by telephone at the time of interpretation on 10/25/2016 at 12:21 pm to Dr. Judeth Horn , who verbally acknowledged these results. Electronically Signed   By: Myles Rosenthal M.D.   On: 10/25/2016 12:23    MDM Known ectopic pregnancy. VSS.  Keep NPO CBC, BHCG, type & screen, & ultrasound ordered C/w Dr. Edward Jolly. Notified of results & exam. Will come see patient.   Assessment and Plan  A; 1. Ectopic pregnancy   2. Abdominal pain affecting pregnancy    P: Dr. Edward Jolly on unit to speak with patient regarding results & plan of care  Judeth Horn 10/25/2016, 11:20 AM

## 2016-10-25 NOTE — Anesthesia Preprocedure Evaluation (Signed)
Anesthesia Evaluation  Patient identified by MRN, date of birth, ID band Patient awake    Reviewed: Allergy & Precautions, NPO status , Patient's Chart, lab work & pertinent test results  Airway Mallampati: II  TM Distance: >3 FB Neck ROM: Full    Dental no notable dental hx.    Pulmonary neg pulmonary ROS,    Pulmonary exam normal breath sounds clear to auscultation       Cardiovascular negative cardio ROS Normal cardiovascular exam Rhythm:Regular Rate:Normal     Neuro/Psych negative neurological ROS  negative psych ROS   GI/Hepatic negative GI ROS, Neg liver ROS,   Endo/Other  negative endocrine ROS  Renal/GU negative Renal ROS     Musculoskeletal negative musculoskeletal ROS (+)   Abdominal   Peds  Hematology negative hematology ROS (+)   Anesthesia Other Findings   Reproductive/Obstetrics Missed AB                             Anesthesia Physical Anesthesia Plan  ASA: II and emergent  Anesthesia Plan: General   Post-op Pain Management:    Induction: Intravenous, Rapid sequence and Cricoid pressure planned  PONV Risk Score and Plan: 4 or greater and Ondansetron, Dexamethasone, Midazolam, Scopolamine patch - Pre-op and Treatment may vary due to age or medical condition  Airway Management Planned: Oral ETT  Additional Equipment:   Intra-op Plan:   Post-operative Plan: Extubation in OR  Informed Consent: I have reviewed the patients History and Physical, chart, labs and discussed the procedure including the risks, benefits and alternatives for the proposed anesthesia with the patient or authorized representative who has indicated his/her understanding and acceptance.   Dental advisory given  Plan Discussed with: CRNA  Anesthesia Plan Comments:         Anesthesia Quick Evaluation

## 2016-10-25 NOTE — H&P (Signed)
GYNECOLOGY  VISIT   HPI: 26 y.o.   Married  Caucasian  female   G1P0000 with Patient's last menstrual period was 08/30/2016.   here for  Known right ectopic pregnancy and increased vaginal bleeding and cramping.  Patient's mother and husband are present for the discussion.   Patient originally presented with post coital bleeding on 10/09/16 and has been followed closely since then with serial hCGs and serial ultrasounds.  The patient was treated 1016/18 for abnormally rising hCGs and a pelvic ultrasound showing a 1.5 cm right adnexal mass consistent with an ectopic pregnancy and no evidence of an intrauterine pregnancy.  Her hCG then was 876.  Her follow up hCG on 10/1916 was 1295.  She received a second methotrexate on 10/23/16 when her hCG level was noted to be 1353.    She called the office this morning with increased cramping and bleeding during the night.  The pain is coming in waves and she has reported some dizziness.  She last ate and drank yogurt and coffee at 10:00 am.   GYNECOLOGIC HISTORY: Patient's last menstrual period was 08/30/2016. Contraception:  None.  Menopausal hormone therapy: NA Last mammogram: NA Last pap smear:    07-20-16 Neg:Neg HR HPV, 07-21-15 ASCUS-H:Pos HR HPV--Colpo revealed LGSIL with neg.ECC        OB History    Gravida Para Term Preterm AB Living   1   0         SAB TAB Ectopic Multiple Live Births                     Patient Active Problem List   Diagnosis Date Noted  . Right tubal pregnancy without intrauterine pregnancy 10/17/2016  . Depression, major, recurrent (HCC) 11/14/2011    Past Medical History:  Diagnosis Date  . Abnormal Pap smear of cervix 07/21/2015   ASCUS cant rule out high grade  . Allergy   . Anxiety   . Depression   . IBS (irritable bowel syndrome)   . Inverse psoriasis     Past Surgical History:  Procedure Laterality Date  . COLPOSCOPY  08/2015   LGSIL  . nexplanon insertion     10-22-13, removed 05-17-16  .  TONSILLECTOMY    . WISDOM TOOTH EXTRACTION      Current Facility-Administered Medications  Medication Dose Route Frequency Provider Last Rate Last Dose  . ceFAZolin (ANCEF) IVPB 2g/100 mL premix  2 g Intravenous 30 min Pre-Op Patton Salles, MD      . famotidine (PEPCID) IVPB 20 mg premix  20 mg Intravenous Once Ardell Isaacs, Brook E, MD      . lactated ringers bolus 1,000 mL  1,000 mL Intravenous Once Ardell Isaacs, Brook E, MD      . lactated ringers infusion   Intravenous Continuous Ardell Isaacs, Brook E, MD      . sodium citrate-citric acid (ORACIT) solution 30 mL  30 mL Oral Once Patton Salles, MD         ALLERGIES: Wheat bran  Family History  Problem Relation Age of Onset  . Cancer Father        skin  . Hypertension Father   . Cancer Maternal Grandmother        liver  . Hypertension Paternal Grandmother   . Asthma Brother     Social History   Social History  . Marital status: Married    Spouse name: N/A  .  Number of children: N/A  . Years of education: N/A   Occupational History  . Not on file.   Social History Main Topics  . Smoking status: Never Smoker  . Smokeless tobacco: Never Used  . Alcohol use No  . Drug use: No  . Sexual activity: Yes    Partners: Male    Birth control/ protection: None   Other Topics Concern  . Not on file   Social History Narrative  . No narrative on file    ROS:  Pertinent items are noted in HPI.  PHYSICAL EXAMINATION:    BP 112/62 (BP Location: Left Arm)   Pulse 70   Resp 18   LMP 08/30/2016     General appearance: alert, cooperative and appears stated age Head: Normocephalic, without obvious abnormality, atraumatic Neck: no adenopathy, supple, symmetrical, trachea midline and thyroid normal to inspection and palpation Lungs: clear to auscultation bilaterally Heart: regular rate and rhythm Abdomen: soft, mild tenderness in the right lower quadrant with no guarding or rebound, no  masses,  no organomegaly Extremities: extremities normal, atraumatic, no cyanosis or edema Skin: Skin color, texture, turgor normal. No rashes or lesions Lymph nodes: Cervical, supraclavicular, and axillary nodes normal. No abnormal inguinal nodes palpated Neurologic: Grossly normal  Pelvic: External genitalia:  no lesions              Urethra:  normal appearing urethra with no masses, tenderness or lesions              Bartholins and Skenes: normal                 Vagina: normal appearing vagina with normal color and discharge, no lesions              Cervix: no lesions                Bimanual Exam:  Uterus:  normal size, contour, position, consistency, mobility, non-tender              Adnexa: no mass, fullness, mild tenderness in the right adnexa.          US today: No IUP.  1.5 cm right adnexal mass consistent with expected right ectopic pregnancy.  Tiny amount of free fluid, decreased since previous ultrasound.  hGC today - 1533. Hgb 35.4 t & S - A positive.   Chaperone was present for exam.  ASSESSMENT  Right ectopic pregnancy.  Unresponsive to methotrexate.  PLAN  Proceed with dilation and evacuation and laparoscopic treatment of ectopic pregnancy, possible right salpingostomy, possible right salpingectomy.  Risks, benefits, and alternatives discussed with the patient who wishes to proceed.    An After Visit Summary was printed and given to the patient.  ___25___ minutes face to face time of which over 50% was spent in counseling.

## 2016-10-25 NOTE — Brief Op Note (Signed)
10/25/2016  4:45 PM  PATIENT:  Melanie HarvestAubrey Danielle Oneill  26 y.o. female  PRE-OPERATIVE DIAGNOSIS:  Ectopic pregnancy, failed methotrexate.  POST-OPERATIVE DIAGNOSIS:  Right ectopic pregnancy.  PROCEDURE:  Procedure(s): DIAGNOSTIC LAPAROSCOPY WITH REMOVAL OF ECTOPIC PREGNANCY (N/A) DILATATION AND EVACUATION (N/A) UNILATERAL SALPINGECTOMY (Right) CHROMOPERTUBATION (N/A)  SURGEON:  Surgeon(s) and Role:    * Amundson Shirley Friar Silva, Zykira Matlack E, MD - Primary    * Oscar LaJertson, Craig GuessJill Evelyn, MD - Assisting  PHYSICIAN ASSISTANT:  NA  ASSISTANTS: Tobi BastosJill E. Jertson, MD   ANESTHESIA:   local and general  EBL:  30 mL   BLOOD ADMINISTERED:none  DRAINS: none   LOCAL MEDICATIONS USED:  MARCAINE     SPECIMEN:  Source of Specimen:  1 - expelled uterine tissue, 2 - endometrial sampling, 3 - right fallopian tube and ectopic pregnancy  DISPOSITION OF SPECIMEN:  PATHOLOGY  COUNTS:  YES  TOURNIQUET:  * No tourniquets in log *  DICTATION: .Other Dictation: Dictation Number    PLAN OF CARE: Discharge to home after PACU  PATIENT DISPOSITION:  PACU - hemodynamically stable.   Delay start of Pharmacological VTE agent (>24hrs) due to surgical blood loss or risk of bleeding: not applicable

## 2016-10-25 NOTE — Op Note (Signed)
Melanie Oneill, GORES NO.:  [id number]  MEDICAL RECORD NO.:  [id number]  LOCATION:  [facility]                          FACILITY:  [facility]  PHYSICIAN:  Randye Lobo, M.D.   DATE OF BIRTH:  November 30, 1990  DATE OF PROCEDURE:  10/25/2016 DATE OF DISCHARGE:  10/25/2016                              OPERATIVE REPORT   PREOPERATIVE DIAGNOSIS: 1. Ectopic pregnancy. 2. Failed methotrexate.  POSTOPERATIVE DIAGNOSIS:   1. Right ectopic pregnancy. 2. Failed methotrexate.  PROCEDURES:  Dilation and evacuation, diagnostic laparoscopy with removal of ectopic pregnancy, right salpingectomy, chromopertubation.  SURGEON:  Randye Lobo, MD.  ASSISTANT:  Gertie Exon, MD.  ANESTHESIA:  General endotracheal, local with 0.25% Marcaine.  IV FLUIDS:  __________  ESTIMATED BLOOD LOSS:  30 mL.  URINE OUTPUT:  350 mL.  COMPLICATIONS:  None.  INDICATIONS FOR PROCEDURE:  The patient is a 26 year old, gravida 1, para 81 Caucasian female, with a last menstrual period on August 30, 2016, who presented with a known right ectopic pregnancy and increased vaginal bleeding and cramping.  The patient presented initially with postcoital bleeding on October 09, 2016, and she has been followed closely with serial quantitative HCG samples.  The patient was treated on October 17, 2016, for abnormally rising HCGs and a pelvic ultrasound showing a 1.5-cm right adnexal mass, which was thought to be consistent with ectopic pregnancy.  There was no evidence of an intrauterine pregnancy.  The patient received 1 dosage of methotrexate when her level of HCG was 876.  The patient subsequently had a continued rise in her HCG level and she received a second dosage on October 23, 2016, when the HCG measured 1353.  The patient called this morning reporting increased cramping and bleeding and dizziness and she was sent to [facility] for re-evaluation.  The patient had a repeat HCG level, which now  measured 1533 and she had a pelvic ultrasound which was unchanged. Her hemoglobin measured 12.4.  The patient was diagnosed with a right ectopic pregnancy and failed methotrexate and the plan was made to proceed with a dilation and evacuation along with a laparoscopy with a possible right salpingostomy versus right salpingectomy and removal of ectopic pregnancy.  Risks, benefits, and alternatives were reviewed with the patient who wished to proceed.  FINDINGS:  Exam under anesthesia revealed vaginal bleeding.  The uterus was small and anteverted.  No adnexal masses were palpable.  The patient had obvious vaginal bleeding and she had tissue which had been expelled from the vagina at the beginning of the case.  Laparoscopy revealed 20 mL of blood in the cul-de-sac.  The right fallopian tube was dilated over the majority of its length.  This was consistent with the location of the pregnancy.  The left fallopian tube and the bilateral ovaries appeared to be normal.  The patient's corpus luteum was noted to be on the left side.  The uterus was normal.  The appendix, gallbladder, liver, stomach, organ, and bowel surfaces appeared to be normal.  There was no evidence of any endometriosis or adhesive disease in the abdomen or pelvis.  SPECIMEN:  Three specimens were sent to Pathology.  The first specimen  was expelled uterine tissue.  The second specimen was the endometrial sampling from the suction dilation, and evacuation.  The third specimen was the right fallopian tube and the ectopic pregnancy.  DESCRIPTION OF PROCEDURE:  The patient was re-identified in the preoperative holding area on M[facility] Admissions.  The patient did receive Ancef for antibiotic prophylaxis and she received both TED hose and PAS stockings for DVT prophylaxis.  In the operating room, the patient was placed in the [facility] stirrups and general endotracheal anesthesia was induced.  The patient's arms were tucked at  her sides and she was supported with a TrenGuard.  The abdomen, vagina, and perineum were sterilely prepped and she was sterilely draped.  A Foley catheter was placed inside the bladder and left to gravity drainage throughout the procedure.  A speculum was placed in the vagina and a single-tooth tenaculum was placed on the anterior cervical lip.  The uterus was sounded to 7 cm. The cervix was gently dilated with Shawnie Pons dilators.  A #6 suction tip curette was introduced through the cervix to the level of the uterine Fundus and withdrawn slight;y.  Proper suction was applied and the  suction tip was then turned in a clockwise fashion as it was removed  from within the uterine cavity. This was repeated one additional time.   A gentle curettage was performed with a serrated curette and  minimal amount of tissue was obtained.  This was all sent to the lab  together as endometrial curettings/sampling.  A Hulka tenaculum was placed inside the uterine cavity, and the speculum was removed.  Attention was turned to the abdomen where a 5-mm infraumbilical incision was created with a scalpel after the skin was injected locally with 0.25% Marcaine.  Towel clips were used to open the incision and a 5-mm trocar with the Optiview was then used to place the trocar inside the peritoneal cavity.  An attempt to introduce the umbilical trocar was also made with a 5-mm bladed trocar, but entry was not possible with this method directly.  Ultimately, the Optiview 5-mm trocar was successfully allowed for entry inside the peritoneal cavity.  A CO2 pneumoperitoneum was achieved.  The area underneath the umbilical trocar was evaluated and was found to be normal in appearance.  The patient was placed in Trendelenburg position and inspection of the pelvic and abdominal organs was performed.  Please refer to the findings above.  A 5-mm left lower quadrant incision was created after the skin was injected locally  with 0.25% Marcaine.  The 5-mm trocar was then placed through this incision under direct visualization of the laparoscope.  An inspection of the pelvic and abdominal organs was performed using a blunt-tip probe.  A 1-cm right lower quadrant incision was created after the skin again was injected with 0.25% Marcaine.  A 10-mm trocar was placed under visualization of the laparoscope.  The procedure began by injecting vasopressin 20 units in 50 mL of normal saline along the serosa of the right fallopian tube.  Monopolar energy was then used with a needle tip on the Nezhat suction irrigator to open  the fallopian tube.  Ectopic pregnancy tissue was extracted and placed in  the anterior cul-de-sac.  The extent of the ectopic pregnancy appeared to be over the majority of the fallopian tube length, and ultimately, a decision was made to remove the right fallopian tube due to the extent of the involvement.  A LigaSure device was used to perform this by using cautery and cutting  along the mesosalpinx extending from the distal toward the proximal fallopian tube.  The fallopian tube was removed from the peritoneal cavity along with the ectopic pregnancy and this was sent together as one specimen to Pathology.  The Hulka tenaculum was switched out to a single-tooth tenaculum and  an acorn cannula.  Chromopertubation was attempted, but the methylene blue  came back out of the vagina and was not filling the uterus well.  The status  Of the left fallopian tube could not be assessed. All of the vaginal  instruments were removed at this time.  The CO2 pneumoperitoneum was partially let down and the operative site was noted to be hemostatic.  The right lower quadrant trocar was removed and a Carter-Thomason fascial closure device was used to place a single suture of 0 Vicryl. The remaining pneumoperitoneum was released and all skin incisions were closed with a subcuticular closure of 4-0 Vicryl.  The  incisions were then closed with Dermabond.  The Foley catheter was removed.  The patient was awakened and extubated and escorted to the recovery room in stable condition.  There were no complications to the procedure.  All needle, instrument, and sponge counts were correct.     Randye LoboBrook E. Silva, M.D.     BES/MEDQ  D:  10/25/2016  T:  10/25/2016  Job:  [id number]

## 2016-10-25 NOTE — Anesthesia Postprocedure Evaluation (Signed)
Anesthesia Post Note  Patient: Bess Harvestubrey Danielle Nuccio  Procedure(s) Performed: DIAGNOSTIC LAPAROSCOPY WITH REMOVAL OF ECTOPIC PREGNANCY (N/A ) DILATATION AND EVACUATION (N/A Vagina ) UNILATERAL SALPINGECTOMY (Right Abdomen) CHROMOPERTUBATION (N/A Uterus)     Patient location during evaluation: PACU Anesthesia Type: General Level of consciousness: sedated and patient cooperative Pain management: pain level controlled Vital Signs Assessment: post-procedure vital signs reviewed and stable Respiratory status: spontaneous breathing Cardiovascular status: stable Anesthetic complications: no    Last Vitals:  Vitals:   10/25/16 1715 10/25/16 1730  BP:  106/63  Pulse:  86  Resp: 18 16  Temp:    SpO2:  96%    Last Pain:  Vitals:   10/25/16 1730  PainSc: 4    Pain Goal: Patients Stated Pain Goal: 3 (10/25/16 1730)               Lewie LoronJohn Mesa Janus

## 2016-10-25 NOTE — Telephone Encounter (Signed)
Spoke with patient after consult with Dr.Silva. Advised will need to be seen at [facility] as soon as possible. Patient will need to remain NPO. Patient is currently drinking coffee. Advised to discontinue at this time. Nothing else to eat or drink until advised otherwise in case she needs to have surgery today. Patient is agreeable. Patient's husband will take her to [facility] at this time. Patient verbalizes understanding.  Routing to provider for final review. Patient agreeable to disposition. Will close encounter.

## 2016-10-26 ENCOUNTER — Encounter (HOSPITAL_COMMUNITY): Payer: Self-pay | Admitting: Obstetrics and Gynecology

## 2016-10-31 ENCOUNTER — Ambulatory Visit (INDEPENDENT_AMBULATORY_CARE_PROVIDER_SITE_OTHER): Payer: BLUE CROSS/BLUE SHIELD | Admitting: Obstetrics and Gynecology

## 2016-10-31 ENCOUNTER — Encounter: Payer: Self-pay | Admitting: Obstetrics and Gynecology

## 2016-10-31 VITALS — BP 102/60 | HR 80 | Resp 16 | Wt 196.0 lb

## 2016-10-31 DIAGNOSIS — Z8759 Personal history of other complications of pregnancy, childbirth and the puerperium: Secondary | ICD-10-CM

## 2016-10-31 DIAGNOSIS — Z9889 Other specified postprocedural states: Secondary | ICD-10-CM

## 2016-10-31 NOTE — Progress Notes (Signed)
GYNECOLOGY  VISIT   HPI: 26 y.o.   Married  Caucasian  female   G1P0000 with Patient's last menstrual period was 08/30/2016.   here for  Post op DIAGNOSTIC LAPAROSCOPY WITH REMOVAL OF ECTOPIC PREGNANCY (N/A ); DILATATION AND EVACUATION (N/A Vagina ); UNILATERAL SALPINGECTOMY (Right Abdomen); CHROMOPERTUBATION (N/A Uterus)   Final pathology - right fallopian tube with ectopic and hydatid cyst.  Decidual reaction on D &  E specimen.  Chromopertubation did not result in filling of the fallopian tube. Dye came back out of the vagina.  Some right lower quadrant soreness.   Feels ready to go back to work tomorrow.  Emotionally ok.  Thinking about future fertility.   GYNECOLOGIC HISTORY: Patient's last menstrual period was 08/30/2016. Contraception:  Currently not sexually active Menopausal hormone therapy:  n/a Last mammogram:  n/a Last pap smear:   07-20-16 Neg:Neg HR HPV, 07-21-15 ASCUS-H:Pos HR HPV--Colpo revealed LGSIL with neg.ECC        OB History    Gravida Para Term Preterm AB Living   1   0         SAB TAB Ectopic Multiple Live Births                     Patient Active Problem List   Diagnosis Date Noted  . Right tubal pregnancy without intrauterine pregnancy 10/17/2016  . Depression, major, recurrent (HCC) 11/14/2011    Past Medical History:  Diagnosis Date  . Abnormal Pap smear of cervix 07/21/2015   ASCUS cant rule out high grade  . Allergy   . Anxiety   . Depression   . IBS (irritable bowel syndrome)   . Inverse psoriasis     Past Surgical History:  Procedure Laterality Date  . CHROMOPERTUBATION N/A 10/25/2016   Procedure: CHROMOPERTUBATION;  Surgeon: Patton Salles, MD;  Location: [facility];  Service: Gynecology;  Laterality: N/A;  . COLPOSCOPY  08/2015   LGSIL  . DIAGNOSTIC LAPAROSCOPY WITH REMOVAL OF ECTOPIC PREGNANCY N/A 10/25/2016   Procedure: DIAGNOSTIC LAPAROSCOPY WITH REMOVAL OF ECTOPIC PREGNANCY;  Surgeon: Patton Salles, MD;   Location: [facility];  Service: Gynecology;  Laterality: N/A;  . DILATION AND EVACUATION N/A 10/25/2016   Procedure: DILATATION AND EVACUATION;  Surgeon: Patton Salles, MD;  Location: [facility];  Service: Gynecology;  Laterality: N/A;  . nexplanon insertion     10-22-13, removed 05-17-16  . TONSILLECTOMY    . UNILATERAL SALPINGECTOMY Right 10/25/2016   Procedure: UNILATERAL SALPINGECTOMY;  Surgeon: Patton Salles, MD;  Location: [facility];  Service: Gynecology;  Laterality: Right;  . WISDOM TOOTH EXTRACTION      Current Outpatient Prescriptions  Medication Sig Dispense Refill  . FLUoxetine (PROZAC) 40 MG capsule Take 40 mg by mouth at bedtime.     Marland Kitchen ibuprofen (ADVIL,MOTRIN) 800 MG tablet Take 1 tablet (800 mg total) by mouth every 8 (eight) hours as needed. 30 tablet 0  . oxyCODONE-acetaminophen (PERCOCET) 5-325 MG tablet Take 1 tablet by mouth every 4 (four) hours as needed. use only as much as needed to relieve pain 30 tablet 0  . Prenatal Vit-Fe Fumarate-FA (PRENATAL MULTIVITAMIN) TABS tablet Take 1 tablet by mouth at bedtime.     No current facility-administered medications for this visit.      ALLERGIES: Wheat bran  Family History  Problem Relation Age of Onset  . Cancer Father        skin  .  Hypertension Father   . Cancer Maternal Grandmother        liver  . Hypertension Paternal Grandmother   . Asthma Brother     Social History   Social History  . Marital status: Married    Spouse name: N/A  . Number of children: N/A  . Years of education: N/A   Occupational History  . Not on file.   Social History Main Topics  . Smoking status: Never Smoker  . Smokeless tobacco: Never Used  . Alcohol use No  . Drug use: No  . Sexual activity: Yes    Partners: Male    Birth control/ protection: None   Other Topics Concern  . Not on file   Social History Narrative  . No narrative on file    ROS:  Pertinent items are noted in HPI.  PHYSICAL EXAMINATION:     BP 102/60 (BP Location: Right Arm, Patient Position: Sitting, Cuff Size: Normal)   Pulse 80   Resp 16   Wt 196 lb (88.9 kg)   LMP 08/30/2016   BMI 29.80 kg/m     General appearance: alert, cooperative and appears stated age   Abdomen: incisions intact.  Abdomen is soft, non-tender, no masses,  no organomegaly  Pelvic: External genitalia:  no lesions              Urethra:  normal appearing urethra with no masses, tenderness or lesions              Bartholins and Skenes: normal                 Vagina: normal appearing vagina with normal color and discharge, no lesions              Cervix: no lesions.  Minimal bloody mucous.                 Bimanual Exam:  Uterus:  normal size, contour, position, consistency, mobility, non-tender              Adnexa: no mass, fullness, tenderness             Chaperone was present for exam.  ASSESSMENT  Status post dilation and evacuation, laparoscopy with right salpingectomy for ectopic pregnancy and chromopertubation.  PLAN  Discussed surgical findings, procedure and pathology report.  I discussed risk of recurrent ectopic pregnancy.  Will plan for sonohysterogram to check for tubal patency the end of December or the beginning of January. She will abstain from intercourse for 6 weeks and prevent pregnancy until the sonohysterogram is done.  Continue PNV.  An After Visit Summary was printed and given to the patient.

## 2016-11-01 DIAGNOSIS — F3181 Bipolar II disorder: Secondary | ICD-10-CM | POA: Diagnosis not present

## 2016-11-02 NOTE — Discharge Summary (Signed)
Physician Discharge Summary  Patient ID: Melanie Oneill MRN: [id number] DOB/AGE: 28-Jul-1990 26 y.o.  Admit date: 10/25/2016 Discharge date: 11/02/2016  Admission Diagnoses:   1.  Right ectopic pregnancy. 2.  Failed methotrexate.  Discharge Diagnoses:    1.  Right ectopic pregnancy. 2.  Failed methotrexate. 3.  Status post dilation and evacuation, laparoscopy with removal of right ectopic pregnancy, right salpingectomy, chromopertubation.  Active Problems:   * No active hospital problems. *   Discharged Condition: good  Hospital Course:  The patient is a 26 year old, gravida 1, para 100 Caucasian female, with a last menstrual period on August 30, 2016, who presented with a known right ectopic pregnancy and increased vaginal bleeding and cramping.  The patient presented initially with postcoital bleeding on October 09, 2016, and she has been followed closely with serial quantitative HCG samples.  The patient was treated on October 17, 2016, for abnormally rising HCGs and a pelvic ultrasound showing a 1.5-cm right adnexal mass, which was thought to be consistent with ectopic pregnancy.  There was no evidence of an intrauterine pregnancy.  The patient received 1 dosage of methotrexate when her level of HCG was 876.  The patient subsequently had a continued rise in her HCG level and she received a second dosage on October 23, 2016, when the HCG measured 1353.  The patient called this morning reporting increased cramping and bleeding and dizziness and she was sent to Maternity Admissions for re-evaluation.  The patient had a repeat HCG level, which now measured 1533 and she had a pelvic ultrasound which was unchanged. Her hemoglobin measured 12.4.  The patient was diagnosed with a right ectopic pregnancy and failed methotrexate and the plan was made to proceed with a dilation and evacuation along with a laparoscopy with a possible right salpingostomy versus right salpingectomy  and removal of ectopic pregnancy.  Risks, benefits, and alternatives were reviewed with the patient who wished to proceed.  The patient underwent a dilation and evacuation, diagnostic laparoscopy with removal of ectopic pregnancy, right salpingectomy, chromopertubation.   Intraoperative findings were consistent with a right ectopic pregnancy. Her left fallopian tube could not be adequately assessed with chromopertubation.  Surgery  was uncomplicated, and the patient was discharged to home in good condition from the PACU.  Her blood type was noted to be A positive.  Consults: None  Significant Diagnostic Studies: See hospital course.  Treatments: surgery:  dilation and evacuation, diagnostic laparoscopy with removal of ectopic pregnancy, right salpingectomy, chromopertubation.    Discharge Exam: Blood pressure (!) 119/59, pulse 75, temperature 98.6 F (37 C), resp. rate 16, last menstrual period 08/30/2016, SpO2 97 %. Intraoperative finding were as follows:  Exam under anesthesia revealed vaginal bleeding.  The uterus was small and anteverted.  No adnexal masses were palpable.  The patient had obvious vaginal bleeding and she had tissue which had been expelled from the vagina at the beginning of the case.  Laparoscopy revealed 20 mL of blood in the cul-de-sac.  The right fallopian tube was dilated over the majority of its length.  This was consistent with the location of the pregnancy.  The left fallopian tube and the bilateral ovaries appeared to be normal.  The patient's corpus luteum was noted to be on the left side.  The uterus was normal.  The appendix, gallbladder, liver, stomach, organ, and bowel surfaces appeared to be normal.  There was no evidence of any endometriosis or adhesive disease in the abdomen or pelvis.  Disposition: 01-Home or  Self Care Post op instructions were provided in verbal and written form.  Allergies as of 10/25/2016      Reactions    Wheat Bran Anaphylaxis      Medication List    TAKE these medications   FLUoxetine 40 MG capsule Commonly known as:  PROZAC Take 40 mg by mouth at bedtime.   ibuprofen 800 MG tablet Commonly known as:  ADVIL,MOTRIN Take 1 tablet (800 mg total) by mouth every 8 (eight) hours as needed.   oxyCODONE-acetaminophen 5-325 MG tablet Commonly known as:  PERCOCET Take 1 tablet by mouth every 4 (four) hours as needed. use only as much as needed to relieve pain   prenatal multivitamin Tabs tablet Take 1 tablet by mouth at bedtime.      Follow-up Information    Melanie Oneill, Melanie Isidore E, MD In 5 days.   Specialty:  Obstetrics and Gynecology Contact information: [address] [place] [id number] [phone]           Signed: Melony OverlyBrook A Oneill 11/02/2016, 5:17 AM

## 2016-11-06 ENCOUNTER — Other Ambulatory Visit: Payer: Self-pay | Admitting: Obstetrics & Gynecology

## 2016-11-06 ENCOUNTER — Telehealth: Payer: Self-pay | Admitting: *Deleted

## 2016-11-06 MED ORDER — IBUPROFEN 800 MG PO TABS: 800.0000 mg | ORAL_TABLET | Freq: Three times a day (TID) | ORAL | 0 refills | Status: DC | PRN

## 2016-11-06 NOTE — Telephone Encounter (Signed)
Call to patient. Ad[facility] [address] Fax #: [phone]. Patient verbalized understanding.   Encounter closed.

## 2016-11-06 NOTE — Telephone Encounter (Signed)
Refill for motrin completed.  Ok to let her know and close encounter.

## 2016-11-06 NOTE — Telephone Encounter (Signed)
Call to patient. Patient advised per Dr. Hyacinth MeekerMiller, that [facility] on [address] has abdominal binders. Patient verbalized understanding. Patient also states she is almost out of ibuprofen 800mg  and wondering if she should continue on that dosage or if she should transition to a different dosage. RN advised would review with Dr. Hyacinth MeekerMiller and return call.   Routing to provider for review.

## 2016-11-08 ENCOUNTER — Telehealth: Payer: Self-pay | Admitting: Obstetrics and Gynecology

## 2016-11-08 ENCOUNTER — Ambulatory Visit (INDEPENDENT_AMBULATORY_CARE_PROVIDER_SITE_OTHER): Payer: BLUE CROSS/BLUE SHIELD | Admitting: Obstetrics and Gynecology

## 2016-11-08 ENCOUNTER — Encounter: Payer: Self-pay | Admitting: Obstetrics and Gynecology

## 2016-11-08 ENCOUNTER — Other Ambulatory Visit: Payer: Self-pay

## 2016-11-08 VITALS — BP 100/68 | HR 84 | Ht 67.75 in | Wt 196.6 lb

## 2016-11-08 DIAGNOSIS — R1031 Right lower quadrant pain: Secondary | ICD-10-CM

## 2016-11-08 LAB — POCT URINALYSIS DIPSTICK
BILIRUBIN UA: NEGATIVE
GLUCOSE UA: NEGATIVE
Ketones, UA: NEGATIVE
Leukocytes, UA: NEGATIVE
Nitrite, UA: NEGATIVE
Protein, UA: NEGATIVE
RBC UA: NEGATIVE
Urobilinogen, UA: 0.2 E.U./dL
pH, UA: 5 (ref 5.0–8.0)

## 2016-11-08 NOTE — Progress Notes (Signed)
GYNECOLOGY  VISIT   HPI: 26 y.o.   Married  Caucasian  female   G1P0000 with Patient's last menstrual period was 08/30/2016.   here for right sided abdominal pain--to the right of navel. This has gotten worse since last PM. She states she pulled a muscle on this side right after surgery, but this pain now is different.  Husband is present for the visit today.  Has been coughing and pulled a right sided muscle this weekend.  Using a binder now.  Pain is not near the right lower quadrant incision. It is above this.   Hurts to move. Pain is focus and less painful than last night.  Cough is now better.  No fever. No flu like symptoms.   Little constipation from oxycodone.  Had a BM last hs and this am.  No nausea or vomiting.   Oxycodone does decrease the pain.   No pain with urination.   No vaginal bleeding.   Urine dip - negative.   GYNECOLOGIC HISTORY: Patient's last menstrual period was 08/30/2016. Contraception: abstinence Menopausal hormone therapy:  n/a Last mammogram:  n/a Last pap smear:  07-20-16 Neg:Neg HR HPV, 07-21-15 ASCUS-H:Pos HR HPV--Colpo revealed LGSIL with neg.ECC        OB History    Gravida Para Term Preterm AB Living   1   0         SAB TAB Ectopic Multiple Live Births                     Patient Active Problem List   Diagnosis Date Noted  . Right tubal pregnancy without intrauterine pregnancy 10/17/2016  . Depression, major, recurrent (HCC) 11/14/2011    Past Medical History:  Diagnosis Date  . Abnormal Pap smear of cervix 07/21/2015   ASCUS cant rule out high grade  . Allergy   . Anxiety   . Depression   . IBS (irritable bowel syndrome)   . Inverse psoriasis     Past Surgical History:  Procedure Laterality Date  . COLPOSCOPY  08/2015   LGSIL  . nexplanon insertion     10-22-13, removed 05-17-16  . TONSILLECTOMY    . WISDOM TOOTH EXTRACTION      Current Outpatient Medications  Medication Sig Dispense Refill  . clobetasol  (OLUX) 0.05 % topical foam APPLY 1 GRAM TOPICALLY BID PRN    . FLUoxetine (PROZAC) 40 MG capsule Take 40 mg by mouth at bedtime.     . hydrocortisone 2.5 % ointment     . ibuprofen (ADVIL,MOTRIN) 800 MG tablet Take 1 tablet (800 mg total) every 8 (eight) hours as needed by mouth. 30 tablet 0  . oxyCODONE-acetaminophen (PERCOCET) 5-325 MG tablet Take 1 tablet by mouth every 4 (four) hours as needed. use only as much as needed to relieve pain 30 tablet 0  . Prenatal Vit-Fe Fumarate-FA (PRENATAL MULTIVITAMIN) TABS tablet Take 1 tablet by mouth at bedtime.     No current facility-administered medications for this visit.      ALLERGIES: Wheat bran  Family History  Problem Relation Age of Onset  . Cancer Father        skin  . Hypertension Father   . Cancer Maternal Grandmother        liver  . Hypertension Paternal Grandmother   . Asthma Brother     Social History   Socioeconomic History  . Marital status: Married    Spouse name: Not on file  . Number of  children: Not on file  . Years of education: Not on file  . Highest education level: Not on file  Social Needs  . Financial resource strain: Not on file  . Food insecurity - worry: Not on file  . Food insecurity - inability: Not on file  . Transportation needs - medical: Not on file  . Transportation needs - non-medical: Not on file  Occupational History  . Not on file  Tobacco Use  . Smoking status: Never Smoker  . Smokeless tobacco: Never Used  Substance and Sexual Activity  . Alcohol use: No  . Drug use: No  . Sexual activity: Yes    Partners: Male    Birth control/protection: None  Other Topics Concern  . Not on file  Social History Narrative  . Not on file    ROS:  Pertinent items are noted in HPI.  PHYSICAL EXAMINATION:    BP 100/68 (BP Location: Right Arm, Patient Position: Sitting, Cuff Size: Large)   Pulse 84   Ht 5' 7.75" (1.721 m)   Wt 196 lb 9.6 oz (89.2 kg)   LMP 08/30/2016   BMI 30.11 kg/m      General appearance: alert, cooperative and appears stated age.  Drinking coffee and smiling. Head: Normocephalic, without obvious abnormality, atraumatic Neck: no adenopathy, supple, symmetrical, trachea midline and thyroid normal to inspection and palpation Lungs: clear to auscultation bilaterally Heart: regular rate and rhythm Abdomen: soft, non-tender, no masses,  no organomegaly Back:  No CVA tenderness. No abnormal inguinal nodes palpated Neurologic: Grossly normal  Pelvic: External genitalia:  no lesions              Urethra:  normal appearing urethra with no masses, tenderness or lesions              Bartholins and Skenes: normal                 Vagina: normal appearing vagina with normal color and discharge, no lesions              Cervix: no lesions                Bimanual Exam:  Uterus:  normal size, contour, position, consistency, mobility, non-tender              Adnexa: no mass, fullness, tenderness              Rectal exam: Yes.  .  Confirms.              Anus:  normal sphincter tone, no lesions  Chaperone was present for exam.  ASSESSMENT  RLQ pain.  No acute abdomen.  Status post laparoscopic right salpingectomy, chromopertubation, and dilation and evacuation for right ectopic pregnancy.   PLAN  Check urine dip, CBC with diff, CMP.  Percocet and Ibuprofen for pain.  Heat.  Decrease activity.  Call if the pain persists or worsens. No imaging needed at this time.   An After Visit Summary was printed and given to the patient.  __15__ minutes face to face time of which over 50% was spent in counseling.

## 2016-11-08 NOTE — Telephone Encounter (Signed)
Spoke with patient, s/p surgery for ectopic pregnancy on 10/25/16. Reports sharp/stabbing pain in RLQ that started last night, worse with movement. Tender to touch. 2/10 when lying down, 8/10 with movement.   Took 1 percocet at 1am and 800mg  motrin at 5:30am.   Denies N/V, fever, bleeding, urinary complaints.  Last BM 11/6, reports as normal but painful.   Recommended OV for further evaluation. Patient scheduled for 12:45pm, advised patient will be worked into Dr. Rica RecordsSilva's schedule. Patient verbalizes understanding and is agreeable.   Routing to provider for final review. Patient is agreeable to disposition. Will close encounter.

## 2016-11-08 NOTE — Telephone Encounter (Signed)
Patient had procedure on 10/25/16 and is having some "weird" sharp stabbing pain in the right side of her abdomen. No fever nausea or vomiting.

## 2016-11-09 ENCOUNTER — Telehealth: Payer: Self-pay | Admitting: *Deleted

## 2016-11-09 LAB — COMPREHENSIVE METABOLIC PANEL
A/G RATIO: 1.9 (ref 1.2–2.2)
ALBUMIN: 4.5 g/dL (ref 3.5–5.5)
ALT: 19 IU/L (ref 0–32)
AST: 18 IU/L (ref 0–40)
Alkaline Phosphatase: 80 IU/L (ref 39–117)
BUN / CREAT RATIO: 17 (ref 9–23)
BUN: 13 mg/dL (ref 6–20)
Bilirubin Total: <0.2 mg/dL (ref 0.0–1.2)
CALCIUM: 9.8 mg/dL (ref 8.7–10.2)
CO2: 28 mmol/L (ref 20–29)
Chloride: 98 mmol/L (ref 96–106)
Creatinine, Ser: 0.75 mg/dL (ref 0.57–1.00)
GFR, EST AFRICAN AMERICAN: 127 mL/min/{1.73_m2} (ref >59)
GFR, EST NON AFRICAN AMERICAN: 110 mL/min/{1.73_m2} (ref >59)
GLOBULIN, TOTAL: 2.4 g/dL (ref 1.5–4.5)
Glucose: 87 mg/dL (ref 65–99)
POTASSIUM: 4 mmol/L (ref 3.5–5.2)
SODIUM: 138 mmol/L (ref 134–144)
TOTAL PROTEIN: 6.9 g/dL (ref 6.0–8.5)

## 2016-11-09 LAB — CBC WITH DIFFERENTIAL/PLATELET
BASOS: 0 %
Basophils Absolute: 0 10*3/uL (ref 0.0–0.2)
EOS (ABSOLUTE): 0.1 10*3/uL (ref 0.0–0.4)
EOS: 2 %
HEMATOCRIT: 37.4 % (ref 34.0–46.6)
HEMOGLOBIN: 12.5 g/dL (ref 11.1–15.9)
IMMATURE GRANS (ABS): 0 10*3/uL (ref 0.0–0.1)
IMMATURE GRANULOCYTES: 0 %
Lymphocytes Absolute: 2.1 10*3/uL (ref 0.7–3.1)
Lymphs: 29 %
MCH: 29.5 pg (ref 26.6–33.0)
MCHC: 33.4 g/dL (ref 31.5–35.7)
MCV: 88 fL (ref 79–97)
MONOCYTES: 5 %
MONOS ABS: 0.4 10*3/uL (ref 0.1–0.9)
NEUTROS PCT: 64 %
Neutrophils Absolute: 4.7 10*3/uL (ref 1.4–7.0)
Platelets: 284 10*3/uL (ref 150–379)
RBC: 4.24 x10E6/uL (ref 3.77–5.28)
RDW: 12.9 % (ref 12.3–15.4)
WBC: 7.4 10*3/uL (ref 3.4–10.8)

## 2016-11-09 NOTE — Telephone Encounter (Signed)
Dr. Edward JollySilva, routing FYI. Will close encounter.   Notes recorded by Leda MinHamm, Hortense Cantrall N, RN on 11/09/2016 at 9:25 AM EST Spoke with patient, advised as seen below per Dr. Edward JollySilva. Patient states she is feeling a little better, was able to sleep most of the day and through the night. Patient aware to return call to office with any additional questions or concerns. Patient verbalizes understanding and is agreeable. See telephone encounter dated 11/09/16 to review with provider. ------  Notes recorded by [name], Brook E, MD on 11/09/2016 at 9:09 AM EST Please let patient know that her blood counts and metabolic profile are normal.  Please ask her how she is doing with her right lower quadrant pain.  She was seen yesterday for a work in appointment due to the pain.

## 2016-11-21 DIAGNOSIS — M654 Radial styloid tenosynovitis [de Quervain]: Secondary | ICD-10-CM | POA: Diagnosis not present

## 2016-11-21 DIAGNOSIS — S63592D Other specified sprain of left wrist, subsequent encounter: Secondary | ICD-10-CM | POA: Diagnosis not present

## 2016-11-28 DIAGNOSIS — F3181 Bipolar II disorder: Secondary | ICD-10-CM | POA: Diagnosis not present

## 2016-12-04 ENCOUNTER — Telehealth: Payer: Self-pay | Admitting: Obstetrics and Gynecology

## 2016-12-04 NOTE — Telephone Encounter (Signed)
Spoke with patient regarding benefit for recommended sonohysterogram. Patient understood and agreeable. Patient is scheduled 12/21/16 with Dr Edward JollySilva, date requested by patient. Patient aware of appointment date, arrival time and cancellation policy. Patient had no further questions.    cc: Dr Edward JollySilva

## 2016-12-21 ENCOUNTER — Other Ambulatory Visit: Payer: Self-pay | Admitting: Obstetrics & Gynecology

## 2016-12-21 ENCOUNTER — Other Ambulatory Visit: Payer: BLUE CROSS/BLUE SHIELD | Admitting: Obstetrics and Gynecology

## 2016-12-21 ENCOUNTER — Other Ambulatory Visit: Payer: BLUE CROSS/BLUE SHIELD

## 2016-12-21 ENCOUNTER — Ambulatory Visit (INDEPENDENT_AMBULATORY_CARE_PROVIDER_SITE_OTHER): Payer: BLUE CROSS/BLUE SHIELD | Admitting: Obstetrics & Gynecology

## 2016-12-21 ENCOUNTER — Other Ambulatory Visit: Payer: Self-pay

## 2016-12-21 ENCOUNTER — Ambulatory Visit (INDEPENDENT_AMBULATORY_CARE_PROVIDER_SITE_OTHER): Payer: BLUE CROSS/BLUE SHIELD

## 2016-12-21 DIAGNOSIS — N83202 Unspecified ovarian cyst, left side: Secondary | ICD-10-CM | POA: Diagnosis not present

## 2016-12-21 DIAGNOSIS — Z8759 Personal history of other complications of pregnancy, childbirth and the puerperium: Secondary | ICD-10-CM

## 2016-12-21 DIAGNOSIS — F3181 Bipolar II disorder: Secondary | ICD-10-CM | POA: Diagnosis not present

## 2016-12-21 NOTE — Progress Notes (Signed)
26 y.o. G1P0001 (ectopic) Married Caucasian female here with hx of ectopic pregnancy earlier this year and right salpingectomy done for treatment.  Chromopertubation was attempted but fluid came back out of the vagina.  So, SHGM was recommended.  Pt is not using anything for contraception.  Pt specifically asked for today's date due to work restructions.  Patient's last menstrual period was 11/28/2016 (exact date).  Contraception: nothing  Findings:  UTERUS: 6.0 x 4.0 x 3.0cm EMS: 7.478mm ADNEXA: Left ovary: 3.2 x 2.4 x 2.0cm with 15. X 1.3cm corpus luteal cyst, collapsed       Right ovary: 2.6 x 1.5 x 2.3cm CUL DE SAC: mild free fluid noted at right adnexa  Discussion:  Findings reviewed with pt.  Considering she is using no contraception and where she is in her cycle, it is advised to wait and [facility] timed with menses.  Pt comfortable with plan.  Assessment:  Left ovarian cyst Possible occluded fallopian tube  Plan:  [facility] today after she has a cycle.  She will call to schedule.  ~15 minutes spent with patient >50% of time was in face to face discussion of above.

## 2016-12-22 ENCOUNTER — Encounter: Payer: Self-pay | Admitting: Obstetrics & Gynecology

## 2017-01-02 NOTE — L&D Delivery Note (Signed)
Delivery Note At 10:56 PM a viable female was delivered via Vaginal, Spontaneous (Presentation: LOA ;  ).  APGAR: 8, 8; weight pending  .   Placenta status: normal .  Cord:  3VC with the following complications: .  Cord pH: not sent  Elevated Bps on arrival with PIH normal, likely s/s pain. Resolved after CLE.   Anesthesia: CLE   Episiotomy: None Lacerations: 2nd degree;Sulcus Suture Repair: 2.0 3.0 vicryl Est. Blood Loss (mL): 800  Rectal done given deep second degree and no occult 3rd/4th degree tears. It's a boy - "Melanie Oneill"! Mom to postpartum .  Baby to Couplet care / Skin to Skin.  Melanie Oneill 12/09/2017, 11:33 PM

## 2017-01-03 ENCOUNTER — Other Ambulatory Visit: Payer: Self-pay | Admitting: *Deleted

## 2017-01-03 DIAGNOSIS — N83202 Unspecified ovarian cyst, left side: Secondary | ICD-10-CM

## 2017-01-03 NOTE — Progress Notes (Signed)
sonon

## 2017-01-04 ENCOUNTER — Ambulatory Visit (INDEPENDENT_AMBULATORY_CARE_PROVIDER_SITE_OTHER): Payer: BLUE CROSS/BLUE SHIELD | Admitting: Obstetrics & Gynecology

## 2017-01-04 ENCOUNTER — Other Ambulatory Visit: Payer: BLUE CROSS/BLUE SHIELD | Admitting: Obstetrics & Gynecology

## 2017-01-04 ENCOUNTER — Ambulatory Visit (INDEPENDENT_AMBULATORY_CARE_PROVIDER_SITE_OTHER): Payer: BLUE CROSS/BLUE SHIELD

## 2017-01-04 ENCOUNTER — Other Ambulatory Visit: Payer: BLUE CROSS/BLUE SHIELD

## 2017-01-04 VITALS — BP 110/86 | HR 76 | Resp 14 | Ht 67.75 in | Wt 196.0 lb

## 2017-01-04 DIAGNOSIS — Z8759 Personal history of other complications of pregnancy, childbirth and the puerperium: Secondary | ICD-10-CM | POA: Diagnosis not present

## 2017-01-04 DIAGNOSIS — N898 Other specified noninflammatory disorders of vagina: Secondary | ICD-10-CM

## 2017-01-04 DIAGNOSIS — N83202 Unspecified ovarian cyst, left side: Secondary | ICD-10-CM | POA: Diagnosis not present

## 2017-01-04 DIAGNOSIS — N971 Female infertility of tubal origin: Secondary | ICD-10-CM

## 2017-01-04 MED ORDER — CLOBETASOL PROPIONATE 0.05 % EX OINT: 1.0000 "application " | TOPICAL_OINTMENT | Freq: Two times a day (BID) | CUTANEOUS | 0 refills | Status: DC

## 2017-01-04 MED ORDER — DOXYCYCLINE HYCLATE 50 MG PO CAPS: 100.0000 mg | ORAL_CAPSULE | Freq: Two times a day (BID) | ORAL | 0 refills | Status: DC

## 2017-01-04 NOTE — Progress Notes (Signed)
27 y.o. G1Ectopic1 Marriedfemale here for a pelvic ultrasound with sonohysterogram.  At time of ectopic pregnancy, chromopertubation was attempted but no tubal patency was noted.  Possible spasm was discussed.  Has ultrasound done 12/21/16 but was too late in cycle to safely perform SHGM.  Here for this today.  Has complaint of vaginal discharge and itching.  Having some vulvar itching as well.  Would like evaluated today if possible  Patient's last menstrual period was 12/26/2016 (exact date).  Contraception:  none  Technique:  Both transabdominal and transvaginal ultrasound examinations of the pelvis were performed. Transabdominal technique was performed for global imaging of the pelvis including uterus, ovaries, adnexal regions, and pelvic cul-de-sac.  It was necessary to proceed with endovaginal exam following the abdominal ultrasound transabdominal exam to visualize the endometrium and adnexa.  Color and duplex Doppler ultrasound was utilized to evaluate blood flow to the ovaries.   FINDINGS: Uterus:  7.0 x 4.0 x 2.7cm Endometrium: 7.327mm Adnexa:  Left: 3.3 x 2.0 x 1.9cm     Right: appears normal Cul de sac: no free fluid  SHSG:  After obtaining appropriate verbal consent from patient, the cervix was visualized using a speculum.  Affirm testing obtained.  No vaginal or vulvar lesions were noted.  Cervix then cleansed and prepped with Hibiclens.  A tenaculum  was not applied to the cervix.  Dilation of the cervix was not necessary. The catheter was passed into the uterus and sterile saline introduced, with the following findings: left tubal patency with fluid spill from tube.  Entire procedure done with sterile technique.  Pt tolerated procedure well  Assessment: Patent left fallopian tube Vaginal discharge/itching  Plan:   Doxycycline 100mg  bid x 3 days given.  Pt given symptoms to monitor for after procedure today for infection.  Will call with any concerns. May proceed with trying  for pregnancy Affirm pending Clobetasol ointment 0.05% bid until affirm has resulted. Medications reviewed.  Pt is going to decrase Prozac in 1/2 once has positive UPT but is not going to stop as she feels this is not in her best interested.  As well, breast feeding and medication reviewed.  Has not ultimately decided what she will do at that time.  Pumping and dumping with peak of medication in breast milk discussed.  She has not considered this and will.  ~15 minutes spent with patient >50% of time was in face to face discussion of above.

## 2017-01-05 LAB — VAGINITIS/VAGINOSIS, DNA PROBE
CANDIDA SPECIES: NEGATIVE
GARDNERELLA VAGINALIS: POSITIVE — AB
Trichomonas vaginosis: NEGATIVE

## 2017-01-08 ENCOUNTER — Encounter: Payer: Self-pay | Admitting: Obstetrics & Gynecology

## 2017-01-08 ENCOUNTER — Other Ambulatory Visit: Payer: Self-pay | Admitting: *Deleted

## 2017-01-08 MED ORDER — CLINDAMYCIN PHOSPHATE 2 % VA CREA: 1.0000 | TOPICAL_CREAM | Freq: Every day | VAGINAL | Status: DC

## 2017-01-13 ENCOUNTER — Encounter: Payer: Self-pay | Admitting: Obstetrics & Gynecology

## 2017-01-14 ENCOUNTER — Other Ambulatory Visit: Payer: Self-pay | Admitting: Obstetrics & Gynecology

## 2017-01-14 MED ORDER — CLINDAMYCIN PHOSPHATE 2 % VA CREA: 1.0000 | TOPICAL_CREAM | Freq: Every day | VAGINAL | 0 refills | Status: DC

## 2017-01-24 DIAGNOSIS — F3181 Bipolar II disorder: Secondary | ICD-10-CM | POA: Diagnosis not present

## 2017-02-21 DIAGNOSIS — F3181 Bipolar II disorder: Secondary | ICD-10-CM | POA: Diagnosis not present

## 2017-03-26 DIAGNOSIS — F3181 Bipolar II disorder: Secondary | ICD-10-CM | POA: Diagnosis not present

## 2017-04-11 ENCOUNTER — Telehealth: Payer: Self-pay

## 2017-04-11 ENCOUNTER — Ambulatory Visit: Payer: BLUE CROSS/BLUE SHIELD | Admitting: Obstetrics and Gynecology

## 2017-04-11 ENCOUNTER — Encounter: Payer: Self-pay | Admitting: Obstetrics and Gynecology

## 2017-04-11 VITALS — BP 122/68 | HR 76 | Ht 67.75 in | Wt 208.4 lb

## 2017-04-11 DIAGNOSIS — Z8759 Personal history of other complications of pregnancy, childbirth and the puerperium: Secondary | ICD-10-CM | POA: Diagnosis not present

## 2017-04-11 DIAGNOSIS — Z3201 Encounter for pregnancy test, result positive: Secondary | ICD-10-CM | POA: Diagnosis not present

## 2017-04-11 LAB — POCT URINE PREGNANCY: Preg Test, Ur: POSITIVE — AB

## 2017-04-11 LAB — BETA HCG QUANT (REF LAB): hCG Quant: 50 m[IU]/mL

## 2017-04-11 NOTE — Telephone Encounter (Signed)
Left message to call Kaitlyn at [phone].  HCG level is 50. Needs to keep appointment for 04/13/2017 in office to recheck HCG level.

## 2017-04-11 NOTE — Patient Instructions (Addendum)
Call for vaginal bleeding or pelvic pain.   Please do not have intercourse at this time.

## 2017-04-11 NOTE — Progress Notes (Signed)
GYNECOLOGY  VISIT   HPI: 27 y.o.   Married  Caucasian  female   G1P0000 with Patient's last menstrual period was 03/16/2016 (exact date).   here for pregnancy confirmation. Patient has had 2 out of 3 positive home UPTs. First had a positive UPT 2 days ago.  Had a little bit of brown vaginal dischage last hs.  Some discomfort in her "right ovary" early this week.  Some cramping midline. Some constipation. Feels tired.   On PNV.   Hx laparoscopy with removal of ectopic pregnancy with right salpingectomy.   On Prozac.   UPT: faintly Pos.  GYNECOLOGIC HISTORY: Patient's last menstrual period was 03/16/2016 (exact date). Contraception:  none Menopausal hormone therapy:  n/a Last mammogram:  n/a Last pap smear:   07-20-16 Neg:Neg HR HPV, 07-21-15 ASCUS-H:Pos HR HPV--Colpo revealed LGSIL with neg.ECC        OB History    Gravida  1   Para      Term  0   Preterm      AB  1   Living        SAB      TAB      Ectopic  1   Multiple      Live Births                 Patient Active Problem List   Diagnosis Date Noted  . Right tubal pregnancy without intrauterine pregnancy 10/17/2016  . Depression, major, recurrent (HCC) 11/14/2011    Past Medical History:  Diagnosis Date  . Abnormal Pap smear of cervix 07/21/2015   ASCUS cant rule out high grade  . Allergy   . Anxiety   . Depression   . IBS (irritable bowel syndrome)   . Inverse psoriasis     Past Surgical History:  Procedure Laterality Date  . CHROMOPERTUBATION N/A 10/25/2016   Procedure: CHROMOPERTUBATION;  Surgeon: Patton Salles, MD;  Location: [facility];  Service: Gynecology;  Laterality: N/A;  . COLPOSCOPY  08/2015   LGSIL  . DIAGNOSTIC LAPAROSCOPY WITH REMOVAL OF ECTOPIC PREGNANCY N/A 10/25/2016   Procedure: DIAGNOSTIC LAPAROSCOPY WITH REMOVAL OF ECTOPIC PREGNANCY;  Surgeon: Patton Salles, MD;  Location: [facility];  Service: Gynecology;  Laterality: N/A;  . DILATION AND  EVACUATION N/A 10/25/2016   Procedure: DILATATION AND EVACUATION;  Surgeon: Patton Salles, MD;  Location: [facility];  Service: Gynecology;  Laterality: N/A;  . nexplanon insertion     10-22-13, removed 05-17-16  . TONSILLECTOMY    . UNILATERAL SALPINGECTOMY Right 10/25/2016   Procedure: UNILATERAL SALPINGECTOMY;  Surgeon: Patton Salles, MD;  Location: [facility];  Service: Gynecology;  Laterality: Right;  . WISDOM TOOTH EXTRACTION      Current Outpatient Medications  Medication Sig Dispense Refill  . clobetasol ointment (TEMOVATE) 0.05 % Apply 1 application topically 2 (two) times daily. Apply as directed twice daily.  Do not use for more than 7-10 days. 60 g 0  . FLUoxetine (PROZAC) 40 MG capsule Take 40 mg by mouth at bedtime.     . Prenatal Vit-Fe Fumarate-FA (PRENATAL MULTIVITAMIN) TABS tablet Take 1 tablet by mouth at bedtime.    . clobetasol (OLUX) 0.05 % topical foam APPLY 1 GRAM TOPICALLY BID PRN     No current facility-administered medications for this visit.      ALLERGIES: Wheat bran  Family History  Problem Relation Age of Onset  . Cancer Father  skin  . Hypertension Father   . Cancer Maternal Grandmother        liver  . Hypertension Paternal Grandmother   . Asthma Brother     Social History   Socioeconomic History  . Marital status: Married    Spouse name: Not on file  . Number of children: Not on file  . Years of education: Not on file  . Highest education level: Not on file  Occupational History  . Not on file  Social Needs  . Financial resource strain: Not on file  . Food insecurity:    Worry: Not on file    Inability: Not on file  . Transportation needs:    Medical: Not on file    Non-medical: Not on file  Tobacco Use  . Smoking status: Never Smoker  . Smokeless tobacco: Never Used  Substance and Sexual Activity  . Alcohol use: No  . Drug use: No  . Sexual activity: Yes    Partners: Male    Birth control/protection:  None  Lifestyle  . Physical activity:    Days per week: Not on file    Minutes per session: Not on file  . Stress: Not on file  Relationships  . Social connections:    Talks on phone: Not on file    Gets together: Not on file    Attends religious service: Not on file    Active member of club or organization: Not on file    Attends meetings of clubs or organizations: Not on file    Relationship status: Not on file  . Intimate partner violence:    Fear of current or ex partner: Not on file    Emotionally abused: Not on file    Physically abused: Not on file    Forced sexual activity: Not on file  Other Topics Concern  . Not on file  Social History Narrative  . Not on file    ROS:  Pertinent items are noted in HPI.  PHYSICAL EXAMINATION:    BP 122/68 (BP Location: Right Arm, Patient Position: Sitting, Cuff Size: Large)   Pulse 76   Ht 5' 7.75" (1.721 m)   Wt 208 lb 6.4 oz (94.5 kg)   LMP 03/16/2016 (Exact Date)   BMI 31.92 kg/m     General appearance: alert, cooperative and appears stated age    Pelvic: External genitalia:  no lesions              Urethra:  normal appearing urethra with no masses, tenderness or lesions              Bartholins and Skenes: normal                 Vagina: normal appearing vagina with normal color and discharge, no lesions              Cervix: no lesions.  Slight tan colored mucous.                 Bimanual Exam:  Uterus:  normal size, contour, position, consistency, mobility, non-tender              Adnexa: no mass, fullness, tenderness         Chaperone was present for exam.  ASSESSMENT  Positive pregnancy test.  Hx ectopic pregnancy.  Depression.  On Prozac.   PLAN  STAT quant beta hCG now.  Discussed potential risk of ectopic pregnancy.  Return for bleeding or pelvic pain.  Pelvic rest.  Lab visit in 2 days for repeat hCG.  We reviewed the safety profile of Prozac which she will continue at this time.   An After Visit  Summary was printed and given to the patient.  __15____ minutes face to face time of which over 50% was spent in counseling.

## 2017-04-13 ENCOUNTER — Other Ambulatory Visit: Payer: Self-pay | Admitting: Obstetrics and Gynecology

## 2017-04-13 ENCOUNTER — Other Ambulatory Visit (INDEPENDENT_AMBULATORY_CARE_PROVIDER_SITE_OTHER): Payer: BLUE CROSS/BLUE SHIELD

## 2017-04-13 DIAGNOSIS — Z3201 Encounter for pregnancy test, result positive: Secondary | ICD-10-CM

## 2017-04-13 DIAGNOSIS — Z3490 Encounter for supervision of normal pregnancy, unspecified, unspecified trimester: Secondary | ICD-10-CM

## 2017-04-13 LAB — BETA HCG QUANT (REF LAB): hCG Quant: 134 m[IU]/mL

## 2017-04-13 NOTE — Telephone Encounter (Signed)
-----   Message from Patton SallesBrook E Amundson C Silva, MD sent at 04/13/2017 11:15 AM EDT ----- Results to patient through My Chart. Please contact the patient to be sure she receives this message.  Cecil CobbsHi Eisa,   Your pregnancy hormone level is rising normally!  Please return on Monday for your next blood test.   Call the office if you have bleeding or pain.   I will have the office contact you to be sure you receive this message!  Conley SimmondsBrook Silva, MD

## 2017-04-13 NOTE — Telephone Encounter (Signed)
Left message to call Kaitlyn at [phone]. 

## 2017-04-16 ENCOUNTER — Other Ambulatory Visit (INDEPENDENT_AMBULATORY_CARE_PROVIDER_SITE_OTHER): Payer: BLUE CROSS/BLUE SHIELD

## 2017-04-16 DIAGNOSIS — N912 Amenorrhea, unspecified: Secondary | ICD-10-CM

## 2017-04-16 DIAGNOSIS — Z3490 Encounter for supervision of normal pregnancy, unspecified, unspecified trimester: Secondary | ICD-10-CM

## 2017-04-16 DIAGNOSIS — Z3201 Encounter for pregnancy test, result positive: Secondary | ICD-10-CM

## 2017-04-16 NOTE — Telephone Encounter (Signed)
Patient called and scheduled an HCG test at 11:30 AM today. FYI only.

## 2017-04-16 NOTE — Telephone Encounter (Signed)
Patient viewed results on 04/13/2017 11:16 AM. Future order is already placed.  Routing to provider for final review. Patient agreeable to disposition. Will close encounter.

## 2017-04-17 ENCOUNTER — Other Ambulatory Visit: Payer: Self-pay | Admitting: Obstetrics and Gynecology

## 2017-04-17 DIAGNOSIS — Z3490 Encounter for supervision of normal pregnancy, unspecified, unspecified trimester: Secondary | ICD-10-CM

## 2017-04-17 LAB — BETA HCG QUANT (REF LAB): hCG Quant: 575 m[IU]/mL

## 2017-04-18 ENCOUNTER — Other Ambulatory Visit (INDEPENDENT_AMBULATORY_CARE_PROVIDER_SITE_OTHER): Payer: BLUE CROSS/BLUE SHIELD

## 2017-04-18 DIAGNOSIS — Z3490 Encounter for supervision of normal pregnancy, unspecified, unspecified trimester: Secondary | ICD-10-CM

## 2017-04-19 ENCOUNTER — Telehealth: Payer: Self-pay | Admitting: *Deleted

## 2017-04-19 DIAGNOSIS — Z3201 Encounter for pregnancy test, result positive: Secondary | ICD-10-CM

## 2017-04-19 DIAGNOSIS — Z8759 Personal history of other complications of pregnancy, childbirth and the puerperium: Secondary | ICD-10-CM

## 2017-04-19 LAB — BETA HCG QUANT (REF LAB): HCG QUANT: 1561 m[IU]/mL

## 2017-04-19 NOTE — Telephone Encounter (Signed)
Notes recorded by Leda MinHamm, Debralee Braaksma N, RN on 04/19/2017 at 1:16 PM EDT Left message to call Noreene LarssonJill at [phone].   Order placed for PUS.

## 2017-04-19 NOTE — Telephone Encounter (Signed)
-----   Message from Patton SallesBrook E Amundson C Silva, MD sent at 04/19/2017 10:52 AM EDT ----- Results to patient through My Chart. Please call patient to schedule an ultrasound for 04/26/17 in the office and then a recheck with me.   Hi Melanie HarborAubrey,   Your hormone levels continue to rise normally!  I would like to see you next week for a pelvic ultrasound.   I will ask the office to call you to schedule this.   Have a lovely weekend.  Conley SimmondsBrook Silva, MD

## 2017-04-23 NOTE — Telephone Encounter (Signed)
Will close encounter

## 2017-04-23 NOTE — Telephone Encounter (Signed)
Spoke with patient regarding benefit for an ultrasound. Patient understood and agreeable. Patient ready to schedule. Patient scheduled 04/26/17 with Dr Edward JollySilva. Patient aware of appointment date, arrival time and cancellation policy. No further questions.   Routing to Dr Edward JollySilva for final review  cc: Carmelina DaneJill Hamm, RN

## 2017-04-26 ENCOUNTER — Ambulatory Visit: Payer: BLUE CROSS/BLUE SHIELD | Admitting: Obstetrics and Gynecology

## 2017-04-26 ENCOUNTER — Encounter: Payer: Self-pay | Admitting: Obstetrics and Gynecology

## 2017-04-26 ENCOUNTER — Other Ambulatory Visit: Payer: Self-pay

## 2017-04-26 ENCOUNTER — Ambulatory Visit (INDEPENDENT_AMBULATORY_CARE_PROVIDER_SITE_OTHER): Payer: BLUE CROSS/BLUE SHIELD

## 2017-04-26 VITALS — BP 100/58 | HR 84 | Resp 18 | Ht 67.75 in | Wt 208.0 lb

## 2017-04-26 DIAGNOSIS — Z3201 Encounter for pregnancy test, result positive: Secondary | ICD-10-CM

## 2017-04-26 DIAGNOSIS — Z3490 Encounter for supervision of normal pregnancy, unspecified, unspecified trimester: Secondary | ICD-10-CM

## 2017-04-26 DIAGNOSIS — Z8759 Personal history of other complications of pregnancy, childbirth and the puerperium: Secondary | ICD-10-CM

## 2017-04-26 NOTE — Progress Notes (Signed)
Encounter reviewed by Dr. Adlai Sinning Amundson C. Silva.  

## 2017-04-26 NOTE — Progress Notes (Signed)
GYNECOLOGY  VISIT   HPI: 27 y.o.   Married  Caucasian  female   G1P0010 with Patient's last menstrual period was 03/16/2017.   here for viability scan.   Normally rising hCGs.  Hx prior ectopic pregnancy.   Some bloating, nausea, vomiting, constipation, and urinary frequency.  Can keep some food down.   GYNECOLOGIC HISTORY: Patient's last menstrual period was 03/16/2017. Contraception:  none Menopausal hormone therapy:  n/a Last mammogram:  n/a Last pap smear:   07-20-16 Neg:Neg HR HPV, 07-21-15 ASCUS-H:Pos HR HPV--Colpo revealed LGSIL with neg.ECC        OB History    Gravida  1   Para      Term  0   Preterm      AB  1   Living        SAB      TAB      Ectopic  1   Multiple      Live Births                 Patient Active Problem List   Diagnosis Date Noted  . Right tubal pregnancy without intrauterine pregnancy 10/17/2016  . Depression, major, recurrent (HCC) 11/14/2011    Past Medical History:  Diagnosis Date  . Abnormal Pap smear of cervix 07/21/2015   ASCUS cant rule out high grade  . Allergy   . Anxiety   . Depression   . IBS (irritable bowel syndrome)   . Inverse psoriasis     Past Surgical History:  Procedure Laterality Date  . CHROMOPERTUBATION N/A 10/25/2016   Procedure: CHROMOPERTUBATION;  Surgeon: Patton Salles, MD;  Location: [facility];  Service: Gynecology;  Laterality: N/A;  . COLPOSCOPY  08/2015   LGSIL  . DIAGNOSTIC LAPAROSCOPY WITH REMOVAL OF ECTOPIC PREGNANCY N/A 10/25/2016   Procedure: DIAGNOSTIC LAPAROSCOPY WITH REMOVAL OF ECTOPIC PREGNANCY;  Surgeon: Patton Salles, MD;  Location: [facility];  Service: Gynecology;  Laterality: N/A;  . DILATION AND EVACUATION N/A 10/25/2016   Procedure: DILATATION AND EVACUATION;  Surgeon: Patton Salles, MD;  Location: [facility];  Service: Gynecology;  Laterality: N/A;  . nexplanon insertion     10-22-13, removed 05-17-16  . TONSILLECTOMY    . UNILATERAL  SALPINGECTOMY Right 10/25/2016   Procedure: UNILATERAL SALPINGECTOMY;  Surgeon: Patton Salles, MD;  Location: [facility];  Service: Gynecology;  Laterality: Right;  . WISDOM TOOTH EXTRACTION      Current Outpatient Medications  Medication Sig Dispense Refill  . FLUoxetine (PROZAC) 40 MG capsule Take 40 mg by mouth at bedtime.     . Prenatal Vit-Fe Fumarate-FA (PRENATAL MULTIVITAMIN) TABS tablet Take 1 tablet by mouth at bedtime.     No current facility-administered medications for this visit.      ALLERGIES: Wheat bran  Family History  Problem Relation Age of Onset  . Cancer Father        skin  . Hypertension Father   . Cancer Maternal Grandmother        liver  . Hypertension Paternal Grandmother   . Asthma Brother     Social History   Socioeconomic History  . Marital status: Married    Spouse name: Not on file  . Number of children: Not on file  . Years of education: Not on file  . Highest education level: Not on file  Occupational History  . Not on file  Social Needs  . Physicist, medical  strain: Not on file  . Food insecurity:    Worry: Not on file    Inability: Not on file  . Transportation needs:    Medical: Not on file    Non-medical: Not on file  Tobacco Use  . Smoking status: Never Smoker  . Smokeless tobacco: Never Used  Substance and Sexual Activity  . Alcohol use: No  . Drug use: No  . Sexual activity: Yes    Partners: Male    Birth control/protection: None  Lifestyle  . Physical activity:    Days per week: Not on file    Minutes per session: Not on file  . Stress: Not on file  Relationships  . Social connections:    Talks on phone: Not on file    Gets together: Not on file    Attends religious service: Not on file    Active member of club or organization: Not on file    Attends meetings of clubs or organizations: Not on file    Relationship status: Not on file  . Intimate partner violence:    Fear of current or ex partner: Not on  file    Emotionally abused: Not on file    Physically abused: Not on file    Forced sexual activity: Not on file  Other Topics Concern  . Not on file  Social History Narrative  . Not on file    ROS:  Pertinent items are noted in HPI.  PHYSICAL EXAMINATION:    BP (!) 100/58 (BP Location: Left Arm, Patient Position: Sitting, Cuff Size: Normal)   Pulse 84   Resp 18   Ht 5' 7.75" (1.721 m)   Wt 208 lb (94.3 kg)   LMP 03/16/2017   BMI 31.86 kg/m     General appearance: alert, cooperative and appears stated age  Pelvic US: Viable IUP 5 + 6 weeks.  FH 103.  Left CL cyst.   ASSESSMENT  Viable IUP. Size equal to dates.  PLAN  Discussed [place] findings and due date.  Reviewed does and don'ts for early prenatal care.  She will establish care with OB office.  List given.  Call for any bleeding or inability to keep food down.  Our congratulations!   An After Visit Summary was printed and given to the patient.  ___15___ minutes face to face time of which over 50% was spent in counseling.

## 2017-05-01 ENCOUNTER — Encounter (HOSPITAL_COMMUNITY): Payer: Self-pay | Admitting: *Deleted

## 2017-05-01 ENCOUNTER — Inpatient Hospital Stay (HOSPITAL_COMMUNITY)
Admission: AD | Admit: 2017-05-01 | Discharge: 2017-05-01 | Disposition: A | Discharge status: Home / Self Care | Payer: BLUE CROSS/BLUE SHIELD | Source: Ambulatory Visit | Attending: Gynecology | Admitting: Gynecology

## 2017-05-01 ENCOUNTER — Encounter: Payer: Self-pay | Admitting: Obstetrics and Gynecology

## 2017-05-01 ENCOUNTER — Telehealth: Payer: Self-pay | Admitting: Obstetrics and Gynecology

## 2017-05-01 DIAGNOSIS — O219 Vomiting of pregnancy, unspecified: Secondary | ICD-10-CM

## 2017-05-01 DIAGNOSIS — O210 Mild hyperemesis gravidarum: Secondary | ICD-10-CM | POA: Insufficient documentation

## 2017-05-01 DIAGNOSIS — Z3A01 Less than 8 weeks gestation of pregnancy: Secondary | ICD-10-CM | POA: Insufficient documentation

## 2017-05-01 LAB — URINALYSIS, ROUTINE W REFLEX MICROSCOPIC
BILIRUBIN URINE: NEGATIVE
GLUCOSE, UA: NEGATIVE mg/dL
Hgb urine dipstick: NEGATIVE
KETONES UR: NEGATIVE mg/dL
Leukocytes, UA: NEGATIVE
Nitrite: NEGATIVE
PH: 6 (ref 5.0–8.0)
Protein, ur: NEGATIVE mg/dL
Specific Gravity, Urine: 1.015 (ref 1.005–1.030)

## 2017-05-01 MED ORDER — ONDANSETRON HCL 8 MG PO TABS: 8.0000 mg | ORAL_TABLET | Freq: Three times a day (TID) | ORAL | 1 refills | Status: DC | PRN

## 2017-05-01 MED ORDER — DOXYLAMINE-PYRIDOXINE 10-10 MG PO TBEC: 10.0000 mg | DELAYED_RELEASE_TABLET | Freq: Three times a day (TID) | ORAL | 3 refills | Status: DC

## 2017-05-01 MED ORDER — ONDANSETRON 8 MG PO TBDP
8.0000 mg | ORAL_TABLET | Freq: Once | ORAL | Status: AC
  Administered 2017-05-01: 8 mg via ORAL
  Filled 2017-05-01: qty 1

## 2017-05-01 NOTE — MAU Provider Note (Addendum)
History   Ms. Even is a 27 yo female G2P0010 who is here for nausea and vomiting. She has a pertinent history of IBS and ectopic pregnancy. The patient has a [place] confirmed viable intrauterine pregnancy on 04/26/2017.   She has been nausea for over a week. She started vomiting yesterday. She vomited 3 times yesterday and one time this morning. She has been able to keep fluids down the whole time but has struggled to keep food down. She was able to eat a little bit last night. She tried Vitamin B6 and ginger root tree tea but threw them back up. She is hungry but doesn't want to eat because of the nausea. She denies sick contacts, new dietary changes, hematemesis, abdominal pain, vaginal pain, vaginal bleeding, vaginal discharge. These symptoms are affecting her average daily activities as she feels like she can not drive when she feels like she does today.  She also admits to some diarrhea yesterday, but has not had a bowel movement since then. She states she periodically has constipation alternating with diarrhea. She denies any recent painful stools or blood in her stool. She also has a mild headache and lightheadedness that she attributes to not eating.   She has not initiated prenatal care, but has had her records sent and plans to set up the appointment this week.  CSN: [id number]  Arrival date and time: 05/01/17 1350   None     Chief Complaint  Patient presents with  . Emesis  . Nausea   HPI  OB History    Gravida  2   Para      Term  0   Preterm      AB  1   Living        SAB      TAB      Ectopic  1   Multiple  0   Live Births              Past Medical History:  Diagnosis Date  . Abnormal Pap smear of cervix 07/21/2015   ASCUS cant rule out high grade  . Allergy   . Anxiety   . Depression   . IBS (irritable bowel syndrome)   . Inverse psoriasis     Past Surgical History:  Procedure Laterality Date  . CHROMOPERTUBATION N/A 10/25/2016    Procedure: CHROMOPERTUBATION;  Surgeon: Patton Salles, MD;  Location: [facility];  Service: Gynecology;  Laterality: N/A;  . COLPOSCOPY  08/2015   LGSIL  . DIAGNOSTIC LAPAROSCOPY WITH REMOVAL OF ECTOPIC PREGNANCY N/A 10/25/2016   Procedure: DIAGNOSTIC LAPAROSCOPY WITH REMOVAL OF ECTOPIC PREGNANCY;  Surgeon: Patton Salles, MD;  Location: [facility];  Service: Gynecology;  Laterality: N/A;  . DILATION AND EVACUATION N/A 10/25/2016   Procedure: DILATATION AND EVACUATION;  Surgeon: Patton Salles, MD;  Location: [facility];  Service: Gynecology;  Laterality: N/A;  . nexplanon insertion     10-22-13, removed 05-17-16  . TONSILLECTOMY    . UNILATERAL SALPINGECTOMY Right 10/25/2016   Procedure: UNILATERAL SALPINGECTOMY;  Surgeon: Patton Salles, MD;  Location: [facility];  Service: Gynecology;  Laterality: Right;  . WISDOM TOOTH EXTRACTION      Family History  Problem Relation Age of Onset  . Cancer Father        skin  . Hypertension Father   . Cancer Maternal Grandmother        liver  . Hypertension Paternal Grandmother   .  Asthma Brother     Social History   Tobacco Use  . Smoking status: Never Smoker  . Smokeless tobacco: Never Used  Substance Use Topics  . Alcohol use: No  . Drug use: No    Allergies:  Allergies  Allergen Reactions  . Wheat Bran Anaphylaxis    Medications Prior to Admission  Medication Sig Dispense Refill Last Dose  . FLUoxetine (PROZAC) 40 MG capsule Take 40 mg by mouth at bedtime.    Taking  . Prenatal Vit-Fe Fumarate-FA (PRENATAL MULTIVITAMIN) TABS tablet Take 1 tablet by mouth at bedtime.   Taking    Review of Systems  Constitutional: Positive for appetite change. Negative for activity change, diaphoresis and fever.  Respiratory: Negative.   Cardiovascular: Negative.   Gastrointestinal: Positive for diarrhea, nausea and vomiting. Negative for abdominal pain.  Genitourinary: Negative for difficulty urinating,  dyspareunia, dysuria, hematuria, vaginal bleeding, vaginal discharge and vaginal pain.  Neurological: Positive for light-headedness and headaches. Negative for dizziness, seizures and syncope.   Physical Exam   Blood pressure 112/62, pulse 83, temperature 98.2 F (36.8 C), temperature source Oral, resp. rate 16, height  (1.727 m), weight 92.5 kg (204 lb), last menstrual period 03/16/2016, SpO2 100 %, unknown if currently breastfeeding.  Physical Exam  Constitutional: She appears well-developed and well-nourished. No distress.  HENT:  Head: Normocephalic and atraumatic.  Mouth/Throat: Oropharynx is clear and moist.  Cardiovascular: Normal rate, regular rhythm, normal heart sounds and intact distal pulses. Exam reveals no gallop and no friction rub.  No murmur heard. Respiratory: Effort normal and breath sounds normal. No respiratory distress. She has no wheezes. She has no rales. She exhibits no tenderness.  GI: Soft. Bowel sounds are normal. She exhibits no distension and no mass. There is no tenderness. There is no rebound and no guarding.  Skin: Skin is warm and dry. She is not diaphoretic.    MAU Course  Procedures N/A  MDM Results for orders placed or performed during the hospital encounter of 05/01/17 (from the past 24 hour(s))  Urinalysis, Routine w reflex microscopic     Status: None   Collection Time: 05/01/17  2:05 PM  Result Value Ref Range   Color, Urine YELLOW YELLOW   APPearance CLEAR CLEAR   Specific Gravity, Urine 1.015 1.005 - 1.030   pH 6.0 5.0 - 8.0   Glucose, UA NEGATIVE NEGATIVE mg/dL   Hgb urine dipstick NEGATIVE NEGATIVE   Bilirubin Urine NEGATIVE NEGATIVE   Ketones, ur NEGATIVE NEGATIVE mg/dL   Protein, ur NEGATIVE NEGATIVE mg/dL   Nitrite NEGATIVE NEGATIVE   Leukocytes, UA NEGATIVE NEGATIVE   Meds ordered this encounter  Medications  . ondansetron (ZOFRAN-ODT) disintegrating tablet 8 mg  . Doxylamine-Pyridoxine 10-10 MG TBEC    Sig: Take 10 mg  by mouth 3 (three) times daily. Take Diclegis at bedtime; if not relief take in the morning and at night.    Dispense:  60 tablet    Refill:  3    Order Specific Question:   Supervising Provider    Answer:   Reva Bores [[id number]]  . ondansetron (ZOFRAN) 8 MG tablet    Sig: Take 1 tablet (8 mg total) by mouth every 8 (eight) hours as needed for nausea or vomiting.    Dispense:  90 tablet    Refill:  1    Order Specific Question:   Supervising Provider    Answer:   Samara Snide   MAU Course Patient arrived with  complaint of one week of nausea and vomiting the last two days. She has always been able to keep down liquids but the last two days could not keep down most of the food she consumed. She also had a complaint of diarrhea yesterday, but has not had a bowel movement today. Patients UA was normal. No signs of dehydration, she denied vaginal bleeding, pain, or discharge, she did not have any abdominal pain. She had [place] on 04/26/2017 that showed viable IUP. Patient given Zofran in MAU with good relief. Patient able to tolerate fluids and food upon discharge. Patient started on Zofran 8 mg every 8 hours PRN and Diclegis at bedtime. Patient advised to return for new or worsening symptoms. Nutrition discussed with the patient. Patient advised to set up initial prenatal visit at the office of her choice.  Assessment and Plan  Assessment  1: Viable intrauterine pregnancy [redacted]w[redacted]d 2: Hyperemesis gravidarum   Plan  Discharge Home, Stable  Zofran Q8H PRN  Start Diclegis BID at bedtime   Instructions given to return to the MAU for new or worsening symptoms  Follow up with Initial prenatal visit   Beverly Sessions 05/01/2017, 5:37 PM    CNM attestation: r I have seen and examined this patient and agree with above documentation in the PA students 's note.   Tyshia Fenter is a 27 y.o. G2P0010 at [redacted]w[redacted]d reporting nausea for two weeks.  Denies LOF, VB, contractions, vaginal  discharge.  PE: Gen: calm comfortable, NAD Resp: normal effort, no distress Heart: Regular rate Abd: Soft, NT, gravid, S=D  ROS, labs, PMH reviewed  Orders Placed This Encounter  Procedures  . Urinalysis, Routine w reflex microscopic   Meds ordered this encounter  Medications  . ondansetron (ZOFRAN-ODT) disintegrating tablet 8 mg  . Doxylamine-Pyridoxine 10-10 MG TBEC    Sig: Take 10 mg by mouth 3 (three) times daily. Take Diclegis at bedtime; if not relief take in the morning and at night.    Dispense:  60 tablet    Refill:  3    Order Specific Question:   Supervising Provider    Answer:   Reva Bores [[id number]]  . ondansetron (ZOFRAN) 8 MG tablet    Sig: Take 1 tablet (8 mg total) by mouth every 8 (eight) hours as needed for nausea or vomiting.    Dispense:  90 tablet    Refill:  1    Order Specific Question:   Supervising Provider    Answer:   Reva Bores [[id number]]    MDM UA: normal, no signs of dehydration.  -Zofran given, patient feels better and tolerated snack and ginger ale.  -Discussed case with Dr. Audie Box, who agrees that patient should be discharged and she can begin prenatal care at [facility].   Assessment: 1. Nausea and vomiting during pregnancy     Plan: - Discharge home in stable condition./ -Eating plan for morning sickness reviewed. Morning sickness medications and how to take reviewed . - Follow-up as scheduled at your doctor's office for next prenatal visit or sooner as needed if symptoms worsen. - Return to maternity admissions symptoms worsen  Marylene Land, [facility] 05/03/2017 6:42 AM

## 2017-05-01 NOTE — Telephone Encounter (Signed)
Spoke with patient. [redacted]wks pregnant.  Has tried small meals and not eating right after getting up. Was able to keep small amount of food down later yesterday afternoon. Reports nausea and vomiting. Lightheaded and weak. Denies pain or bleeding. Does not feel she can drive due to symptoms.   Has not scheduled with OB to date, "will call this week".   Advised will review with Dr. Edward Jolly and return call.   Dr. Edward Jolly -please advise?

## 2017-05-01 NOTE — Telephone Encounter (Signed)
Patient sent the following correspondence through MyChart. Routing to triage to assist patient with request.  ----- Message from Mychart, Generic sent at 05/01/2017 8:58 AM EDT -----    Hi,    I'm having some severe trouble keeping food down. I've only lost 7 lbs, but I'm so nauseous I can't really eat much and I'm lightheaded.     I've also developed a sore throat from either puking or allergies. Are there any over the counter nausea remedies you could recommend??  Melanie Oneill   Last seen: 04/26/17 for viability ultrasound

## 2017-05-01 NOTE — Discharge Instructions (Signed)
Eating Plan for Hyperemesis Gravidarum °Hyperemesis gravidarum is a severe form of morning sickness. Because this condition causes severe nausea and vomiting, it can lead to dehydration, malnutrition, and weight loss. One way to lessen the symptoms of nausea and vomiting is to follow the eating plan for hyperemesis gravidarum. It is often used along with prescribed medicines to control your symptoms. °What can I do to relieve my symptoms? °Listen to your body. Everyone is different and has different preferences. Find what works best for you. Take any of the following actions that are helpful to you: °· Eat and drink slowly. °· Eat 5-6 small meals daily instead of 3 large meals. °· Eat crackers before you get out of bed in the morning. °· Try having a snack in the middle of the night. °· Starchy foods are usually tolerated well. Examples include cereal, toast, bread, potatoes, pasta, rice, and pretzels. °· Ginger may help with nausea. Add ¼ tsp ground ginger to hot tea or choose ginger tea. °· Try drinking 100% fruit juice or an electrolyte drink. An electrolyte drink contains sodium, potassium, and chloride. °· Continue to take your prenatal vitamins as told by your health care provider. If you are having trouble taking your prenatal vitamins, talk with your health care provider about different options. °· Include at least 1 serving of protein with your meals and snacks. Protein options include meats or poultry, beans, nuts, eggs, and yogurt. Try eating a protein-rich snack before bed. Examples of these snacks include cheese and crackers or half of a peanut butter or turkey sandwich. °· Consider eliminating foods that trigger your symptoms. These may include spicy foods, coffee, high-fat foods, very sweet foods, and acidic foods. °· Try meals that have more protein combined with bland, salty, lower-fat, and dry foods, such as nuts, seeds, pretzels, crackers, and cereal. °· Talk with your healthcare provider about  starting a supplement of vitamin B6. °· Have fluids that are cold, clear, and carbonated or sour. Examples include lemonade, ginger ale, lemon-lime soda, ice water, and sparkling water. °· Try lemon or mint tea. °· Try brushing your teeth or using a mouth rinse after meals. ° °What should I avoid to reduce my symptoms? °Avoiding some of the following things may help reduce your symptoms. °· Foods with strong smells. Try eating meals in well-ventilated areas that are free of odors. °· Drinking water or other beverages with meals. Try not to drink anything during the 30 minutes before and after your meals. °· Drinking more than 1 cup of fluid at a time. Sometimes using a straw helps. °· Fried or high-fat foods, such as butter and cream sauces. °· Spicy foods. °· Skipping meals as best as you can. Nausea can be more intense on an empty stomach. If you cannot tolerate food at that time, do not force it. Try sucking on ice chips or other frozen items, and make up for missed calories later. °· Lying down within 2 hours after eating. °· Environmental triggers. These may include smoky rooms, closed spaces, rooms with strong smells, warm or humid places, overly loud and noisy rooms, and rooms with motion or flickering lights. °· Quick and sudden changes in your movement. ° °This information is not intended to replace advice given to you by your health care provider. Make sure you discuss any questions you have with your health care provider. °Document Released: 10/16/2006 Document Revised: 08/18/2015 Document Reviewed: 07/20/2015 °Elsevier Interactive Patient Education © 2018 [employer] ° °

## 2017-05-01 NOTE — MAU Note (Signed)
Pt reports positive preg test and now has nausea, vomiting but has been unable to keep anything down for 2 daya

## 2017-05-01 NOTE — Telephone Encounter (Signed)
Reviewed with Dr. Edward Jolly. Call returned to patient. Instructed patient to go to [facility] for further evaluation, IV hydration likely needed given symptoms. Instructed patient to call and schedule appointment to establish care with OB. Patient verbalizes understanding.    Routing to provider for final review. Patient is agreeable to disposition. Will close encounter.

## 2017-05-10 DIAGNOSIS — F3181 Bipolar II disorder: Secondary | ICD-10-CM | POA: Diagnosis not present

## 2017-05-17 DIAGNOSIS — Z3A08 8 weeks gestation of pregnancy: Secondary | ICD-10-CM | POA: Diagnosis not present

## 2017-05-17 DIAGNOSIS — Z13228 Encounter for screening for other metabolic disorders: Secondary | ICD-10-CM | POA: Diagnosis not present

## 2017-05-17 DIAGNOSIS — Z3480 Encounter for supervision of other normal pregnancy, unspecified trimester: Secondary | ICD-10-CM | POA: Diagnosis not present

## 2017-05-17 DIAGNOSIS — Z3401 Encounter for supervision of normal first pregnancy, first trimester: Secondary | ICD-10-CM | POA: Diagnosis not present

## 2017-05-17 DIAGNOSIS — Z3685 Encounter for antenatal screening for Streptococcus B: Secondary | ICD-10-CM | POA: Diagnosis not present

## 2017-05-18 LAB — OB RESULTS CONSOLE GC/CHLAMYDIA
Chlamydia: NEGATIVE
GC PROBE AMP, GENITAL: NEGATIVE

## 2017-05-18 LAB — OB RESULTS CONSOLE HIV ANTIBODY (ROUTINE TESTING): HIV: NONREACTIVE

## 2017-05-18 LAB — OB RESULTS CONSOLE RUBELLA ANTIBODY, IGM: RUBELLA: IMMUNE

## 2017-05-18 LAB — OB RESULTS CONSOLE RPR: RPR: NONREACTIVE

## 2017-05-18 LAB — OB RESULTS CONSOLE HEPATITIS B SURFACE ANTIGEN: Hepatitis B Surface Ag: NEGATIVE

## 2017-05-25 DIAGNOSIS — Z113 Encounter for screening for infections with a predominantly sexual mode of transmission: Secondary | ICD-10-CM | POA: Diagnosis not present

## 2017-05-25 DIAGNOSIS — Z3400 Encounter for supervision of normal first pregnancy, unspecified trimester: Secondary | ICD-10-CM | POA: Diagnosis not present

## 2017-05-25 DIAGNOSIS — Z3480 Encounter for supervision of other normal pregnancy, unspecified trimester: Secondary | ICD-10-CM | POA: Diagnosis not present

## 2017-05-30 DIAGNOSIS — F3181 Bipolar II disorder: Secondary | ICD-10-CM | POA: Diagnosis not present

## 2017-06-11 DIAGNOSIS — Z36 Encounter for antenatal screening for chromosomal anomalies: Secondary | ICD-10-CM | POA: Diagnosis not present

## 2017-06-11 DIAGNOSIS — Z3A12 12 weeks gestation of pregnancy: Secondary | ICD-10-CM | POA: Diagnosis not present

## 2017-06-11 DIAGNOSIS — Z3682 Encounter for antenatal screening for nuchal translucency: Secondary | ICD-10-CM | POA: Diagnosis not present

## 2017-06-11 DIAGNOSIS — Z3491 Encounter for supervision of normal pregnancy, unspecified, first trimester: Secondary | ICD-10-CM | POA: Diagnosis not present

## 2017-06-15 DIAGNOSIS — F3181 Bipolar II disorder: Secondary | ICD-10-CM | POA: Diagnosis not present

## 2017-06-25 DIAGNOSIS — F3181 Bipolar II disorder: Secondary | ICD-10-CM | POA: Diagnosis not present

## 2017-07-11 DIAGNOSIS — L400 Psoriasis vulgaris: Secondary | ICD-10-CM | POA: Diagnosis not present

## 2017-07-20 DIAGNOSIS — F3181 Bipolar II disorder: Secondary | ICD-10-CM | POA: Diagnosis not present

## 2017-07-23 DIAGNOSIS — Z3480 Encounter for supervision of other normal pregnancy, unspecified trimester: Secondary | ICD-10-CM | POA: Diagnosis not present

## 2017-07-23 DIAGNOSIS — Z363 Encounter for antenatal screening for malformations: Secondary | ICD-10-CM | POA: Diagnosis not present

## 2017-07-23 DIAGNOSIS — Z3A18 18 weeks gestation of pregnancy: Secondary | ICD-10-CM | POA: Diagnosis not present

## 2017-08-24 DIAGNOSIS — F3181 Bipolar II disorder: Secondary | ICD-10-CM | POA: Diagnosis not present

## 2017-08-30 DIAGNOSIS — O26852 Spotting complicating pregnancy, second trimester: Secondary | ICD-10-CM | POA: Diagnosis not present

## 2017-08-30 DIAGNOSIS — Z3A23 23 weeks gestation of pregnancy: Secondary | ICD-10-CM | POA: Diagnosis not present

## 2017-09-14 DIAGNOSIS — F3181 Bipolar II disorder: Secondary | ICD-10-CM | POA: Diagnosis not present

## 2017-09-18 DIAGNOSIS — N760 Acute vaginitis: Secondary | ICD-10-CM | POA: Diagnosis not present

## 2017-09-18 DIAGNOSIS — Z3480 Encounter for supervision of other normal pregnancy, unspecified trimester: Secondary | ICD-10-CM | POA: Diagnosis not present

## 2017-09-18 DIAGNOSIS — Z23 Encounter for immunization: Secondary | ICD-10-CM | POA: Diagnosis not present

## 2017-09-27 DIAGNOSIS — O26852 Spotting complicating pregnancy, second trimester: Secondary | ICD-10-CM | POA: Diagnosis not present

## 2017-09-27 DIAGNOSIS — Z3A27 27 weeks gestation of pregnancy: Secondary | ICD-10-CM | POA: Diagnosis not present

## 2017-10-03 DIAGNOSIS — Z23 Encounter for immunization: Secondary | ICD-10-CM | POA: Diagnosis not present

## 2017-10-12 ENCOUNTER — Encounter (HOSPITAL_COMMUNITY): Payer: Self-pay

## 2017-10-14 ENCOUNTER — Inpatient Hospital Stay (HOSPITAL_COMMUNITY)
Admission: AD | Admit: 2017-10-14 | Discharge: 2017-10-15 | Disposition: A | Discharge status: Home / Self Care | Payer: BLUE CROSS/BLUE SHIELD | Source: Ambulatory Visit | Attending: Obstetrics and Gynecology | Admitting: Obstetrics and Gynecology

## 2017-10-14 DIAGNOSIS — Z3A30 30 weeks gestation of pregnancy: Secondary | ICD-10-CM

## 2017-10-14 DIAGNOSIS — Z3689 Encounter for other specified antenatal screening: Secondary | ICD-10-CM

## 2017-10-14 DIAGNOSIS — R1032 Left lower quadrant pain: Secondary | ICD-10-CM

## 2017-10-14 DIAGNOSIS — R1030 Lower abdominal pain, unspecified: Secondary | ICD-10-CM | POA: Insufficient documentation

## 2017-10-14 DIAGNOSIS — O2343 Unspecified infection of urinary tract in pregnancy, third trimester: Secondary | ICD-10-CM

## 2017-10-15 ENCOUNTER — Encounter (HOSPITAL_COMMUNITY): Payer: Self-pay

## 2017-10-15 ENCOUNTER — Inpatient Hospital Stay (HOSPITAL_BASED_OUTPATIENT_CLINIC_OR_DEPARTMENT_OTHER): Payer: BLUE CROSS/BLUE SHIELD

## 2017-10-15 DIAGNOSIS — R1032 Left lower quadrant pain: Secondary | ICD-10-CM

## 2017-10-15 DIAGNOSIS — O2343 Unspecified infection of urinary tract in pregnancy, third trimester: Secondary | ICD-10-CM | POA: Diagnosis not present

## 2017-10-15 DIAGNOSIS — Z3A30 30 weeks gestation of pregnancy: Secondary | ICD-10-CM | POA: Diagnosis not present

## 2017-10-15 DIAGNOSIS — O26893 Other specified pregnancy related conditions, third trimester: Secondary | ICD-10-CM

## 2017-10-15 DIAGNOSIS — R1030 Lower abdominal pain, unspecified: Secondary | ICD-10-CM | POA: Diagnosis not present

## 2017-10-15 LAB — URINALYSIS, ROUTINE W REFLEX MICROSCOPIC
BILIRUBIN URINE: NEGATIVE
Glucose, UA: NEGATIVE mg/dL
Hgb urine dipstick: NEGATIVE
KETONES UR: NEGATIVE mg/dL
NITRITE: NEGATIVE
PH: 8 (ref 5.0–8.0)
Protein, ur: NEGATIVE mg/dL
SPECIFIC GRAVITY, URINE: 1.009 (ref 1.005–1.030)

## 2017-10-15 LAB — WET PREP, GENITAL
Clue Cells Wet Prep HPF POC: NONE SEEN
SPERM: NONE SEEN
TRICH WET PREP: NONE SEEN
Yeast Wet Prep HPF POC: NONE SEEN

## 2017-10-15 MED ORDER — CEPHALEXIN 500 MG PO CAPS: 500.0000 mg | ORAL_CAPSULE | Freq: Four times a day (QID) | ORAL | 0 refills | Status: AC

## 2017-10-15 MED ORDER — ACETAMINOPHEN 500 MG PO TABS
1000.0000 mg | ORAL_TABLET | Freq: Once | ORAL | Status: AC
  Administered 2017-10-15: 1000 mg via ORAL
  Filled 2017-10-15: qty 2

## 2017-10-15 NOTE — MAU Provider Note (Signed)
History     CSN: [id number]  Arrival date and time: 10/14/17 2359   First Provider Initiated Contact with Patient 10/15/17 0103      Chief Complaint  Patient presents with  . Abdominal Pain   HPI  Melanie Oneill is a 27 y.o. G2P0010 at [redacted]w[redacted]d who presents to MAU with chief complaint of lower abdominal pain. Pain is new complaint, onset last evening. Pain is bilateral, 6-7/10, does not radiate, denies aggravating or alleviating factors. Has not tried to manage with medication. Denies flank pain, urinary symptoms, vaginal bleeding, leaking of fluid, decreased fetal movement, fever, falls, or recent illness.    Patient states she flew to [place] and was a bridesmaid over the weekend. Verbalizes concern that she was "on her feet too much".  OB History    Gravida  2   Para      Term  0   Preterm      AB  1   Living        SAB      TAB      Ectopic  1   Multiple  0   Live Births              Past Medical History:  Diagnosis Date  . Abnormal Pap smear of cervix 07/21/2015   ASCUS cant rule out high grade  . Allergy   . Anxiety   . Depression   . IBS (irritable bowel syndrome)   . Inverse psoriasis     Past Surgical History:  Procedure Laterality Date  . CHROMOPERTUBATION N/A 10/25/2016   Procedure: CHROMOPERTUBATION;  Surgeon: Patton Salles, MD;  Location: [facility];  Service: Gynecology;  Laterality: N/A;  . COLPOSCOPY  08/2015   LGSIL  . DIAGNOSTIC LAPAROSCOPY WITH REMOVAL OF ECTOPIC PREGNANCY N/A 10/25/2016   Procedure: DIAGNOSTIC LAPAROSCOPY WITH REMOVAL OF ECTOPIC PREGNANCY;  Surgeon: Patton Salles, MD;  Location: [facility];  Service: Gynecology;  Laterality: N/A;  . DILATION AND EVACUATION N/A 10/25/2016   Procedure: DILATATION AND EVACUATION;  Surgeon: Patton Salles, MD;  Location: [facility];  Service: Gynecology;  Laterality: N/A;  . nexplanon insertion     10-22-13, removed 05-17-16  . TONSILLECTOMY    .  UNILATERAL SALPINGECTOMY Right 10/25/2016   Procedure: UNILATERAL SALPINGECTOMY;  Surgeon: Patton Salles, MD;  Location: [facility];  Service: Gynecology;  Laterality: Right;  . WISDOM TOOTH EXTRACTION      Family History  Problem Relation Age of Onset  . Cancer Father        skin  . Hypertension Father   . Cancer Maternal Grandmother        liver  . Hypertension Paternal Grandmother   . Asthma Brother     Social History   Tobacco Use  . Smoking status: Never Smoker  . Smokeless tobacco: Never Used  Substance Use Topics  . Alcohol use: No  . Drug use: No    Allergies:  Allergies  Allergen Reactions  . Wheat Bran Anaphylaxis    Medications Prior to Admission  Medication Sig Dispense Refill Last Dose  . cyclobenzaprine (FLEXERIL) 10 MG tablet Take 10 mg by mouth at bedtime.   10/15/2017 at Unknown time  . FLUoxetine (PROZAC) 40 MG capsule Take 40 mg by mouth at bedtime.    10/15/2017 at Unknown time  . Prenatal Vit-Fe Fumarate-FA (PRENATAL MULTIVITAMIN) TABS tablet Take 1 tablet by mouth at bedtime.  10/15/2017 at Unknown time  . Doxylamine-Pyridoxine 10-10 MG TBEC Take 10 mg by mouth 3 (three) times daily. Take Diclegis at bedtime; if not relief take in the morning and at night. 60 tablet 3 Unknown at Unknown time  . ondansetron (ZOFRAN) 8 MG tablet Take 1 tablet (8 mg total) by mouth every 8 (eight) hours as needed for nausea or vomiting. 90 tablet 1 Unknown at Unknown time    Review of Systems  Constitutional: Negative for chills, fatigue and fever.  Respiratory: Negative for shortness of breath.   Gastrointestinal: Positive for abdominal pain. Negative for nausea and vomiting.  Genitourinary: Negative for difficulty urinating, dyspareunia, dysuria, flank pain, vaginal bleeding, vaginal discharge and vaginal pain.  Musculoskeletal: Negative for back pain.  Neurological: Negative for dizziness, syncope, weakness, light-headedness and headaches.  All other  systems reviewed and are negative.  Physical Exam   Blood pressure 110/67, pulse 72, temperature 98 F (36.7 C), temperature source Oral, resp. rate 16, height 5\' 9"  (1.753 m), weight 100.7 kg, last menstrual period 03/16/2017, SpO2 100 %, unknown if currently breastfeeding.  Physical Exam  Nursing note and vitals reviewed. Constitutional: She is oriented to person, place, and time. She appears well-developed and well-nourished.  Cardiovascular: Normal rate, normal heart sounds and intact distal pulses.  Respiratory: Effort normal. No respiratory distress.  GI: Normal appearance. She exhibits no distension. There is no tenderness. There is no rebound, no guarding and no CVA tenderness.  Gravid  Musculoskeletal: Normal range of motion.  Neurological: She is alert and oriented to person, place, and time. She has normal reflexes.  Skin: Skin is warm and dry.  Psychiatric: She has a normal mood and affect. Her behavior is normal. Judgment and thought content normal.    MAU Course  Procedures  MDM  --Reactive fetal tracing: baseline 125, moderate variability, positive accelerations, no decelerations --Toco: uterine irritability Patient Vitals for the past 24 hrs:  BP Temp Temp src Pulse Resp SpO2 Height Weight  10/15/17 0251 110/67 - - - - - - -  10/15/17 0034 132/72 98 F (36.7 C) Oral 72 16 100 % 5\' 9"  (1.753 m) 100.7 kg    Results for orders placed or performed during the hospital encounter of 10/14/17 (from the past 24 hour(s))  Urinalysis, Routine w reflex microscopic     Status: Abnormal   Collection Time: 10/15/17 12:47 AM  Result Value Ref Range   Color, Urine YELLOW YELLOW   APPearance HAZY (A) CLEAR   Specific Gravity, Urine 1.009 1.005 - 1.030   pH 8.0 5.0 - 8.0   Glucose, UA NEGATIVE NEGATIVE mg/dL   Hgb urine dipstick NEGATIVE NEGATIVE   Bilirubin Urine NEGATIVE NEGATIVE   Ketones, ur NEGATIVE NEGATIVE mg/dL   Protein, ur NEGATIVE NEGATIVE mg/dL   Nitrite  NEGATIVE NEGATIVE   Leukocytes, UA LARGE (A) NEGATIVE   RBC / HPF 0-5 0 - 5 RBC/hpf   WBC, UA 21-50 0 - 5 WBC/hpf   Bacteria, UA MANY (A) NONE SEEN   Squamous Epithelial / LPF 11-20 0 - 5  Wet prep, genital     Status: Abnormal   Collection Time: 10/15/17  1:22 AM  Result Value Ref Range   Yeast Wet Prep HPF POC NONE SEEN NONE SEEN   Trich, Wet Prep NONE SEEN NONE SEEN   Clue Cells Wet Prep HPF POC NONE SEEN NONE SEEN   WBC, Wet Prep HPF POC MODERATE (A) NONE SEEN   Sperm NONE SEEN  Meds ordered this encounter  Medications  . acetaminophen (TYLENOL) tablet 1,000 mg  . cephALEXin (KEFLEX) 500 MG capsule    Sig: Take 1 capsule (500 mg total) by mouth 4 (four) times daily for 10 days.    Dispense:  40 capsule    Refill:  0    Order Specific Question:   Supervising Provider    Answer:   Reva Bores [[id number]]    Assessment and Plan  --27 y.o. G2P0010 at [redacted]w[redacted]d  --Reactive fetal tracing --No concerning findings on [place] or physical exam --UTI in pregnancy, Rx Keflex to pharmacy, urine culture pending --Discharge home in stable condition  F/U: Pt has OB appt Tuesday 10/15  Calvert Cantor, CNM 10/15/2017, 3:09 AM

## 2017-10-15 NOTE — MAU Note (Addendum)
PT has had sharp LLQ abdominal pain since 1500 Sunday. PT just got off flight from [facility] before pain starting. She is rating 7/10. Was intermittent, now constant.  Denies bleeding or LOF, +FM.

## 2017-10-15 NOTE — Discharge Instructions (Signed)

## 2017-10-16 LAB — CULTURE, OB URINE

## 2017-10-26 DIAGNOSIS — F3181 Bipolar II disorder: Secondary | ICD-10-CM | POA: Diagnosis not present

## 2017-11-08 DIAGNOSIS — F3181 Bipolar II disorder: Secondary | ICD-10-CM | POA: Diagnosis not present

## 2017-11-18 LAB — OB RESULTS CONSOLE GBS: GBS: NEGATIVE

## 2017-11-24 IMAGING — US US OB COMP LESS 14 WK
1 series · 15 of 28 positions shown · non-contrast
Comparison: None.

CLINICAL DATA: Vaginal bleeding. Patient is 6 weeks 2 days pregnant
by last menstrual period.

EXAM:
OBSTETRIC <14 WK US AND TRANSVAGINAL OB US
TECHNIQUE: Both transabdominal and transvaginal ultrasound examinations were
performed for complete evaluation of the gestation as well as the
maternal uterus, adnexal regions, and pelvic cul-de-sac.
Transvaginal technique was performed to assess early pregnancy.

[Series 1: us ob comp less 14 wk · 15 of 63 slices shown]
[im 1/63]
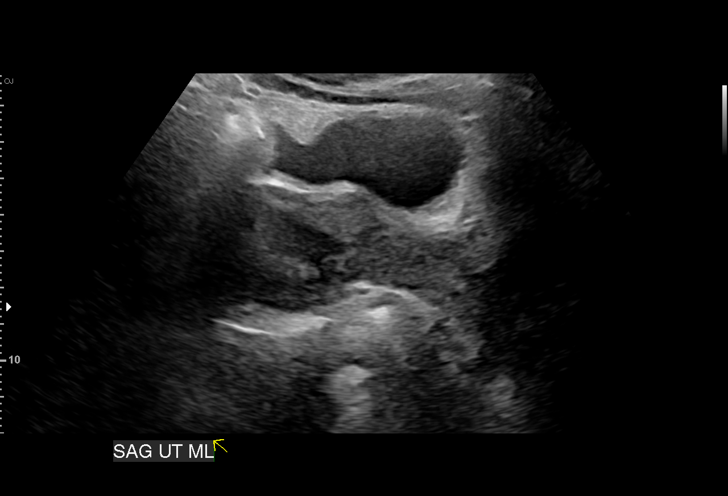
[im 5/63]
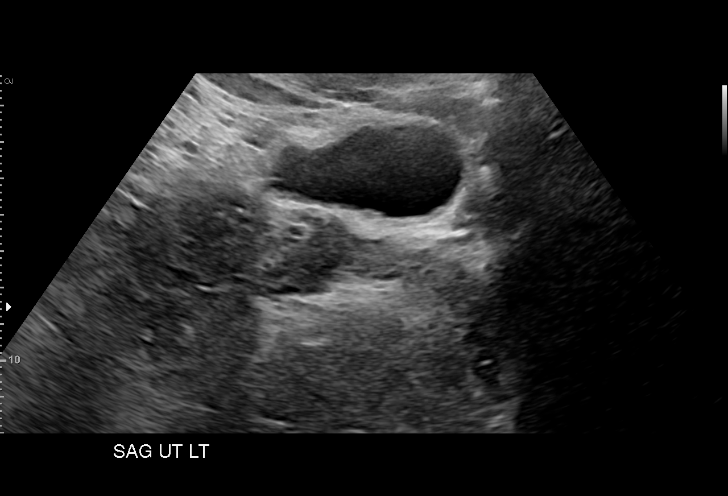
[im 10/63]
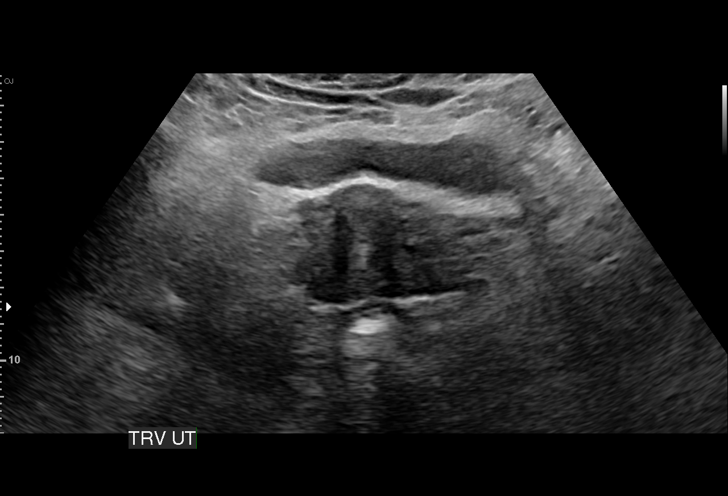
[im 14/63]
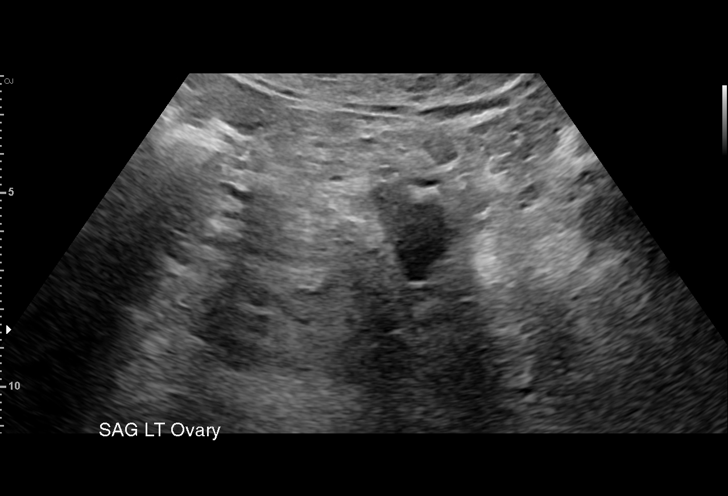
[im 19/63]
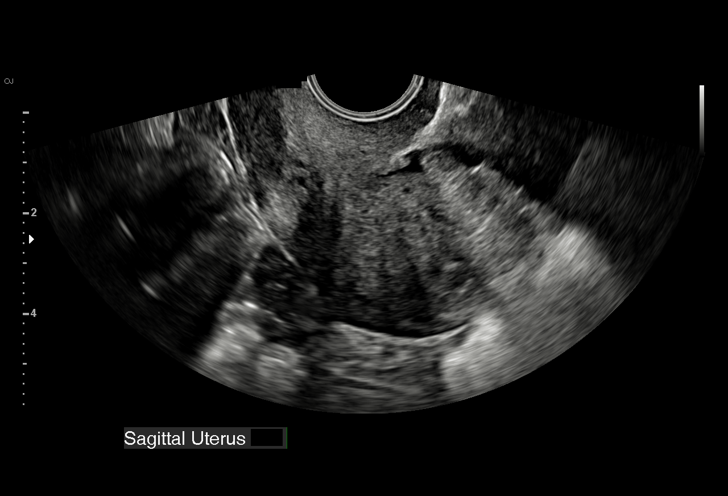
[im 23/63]
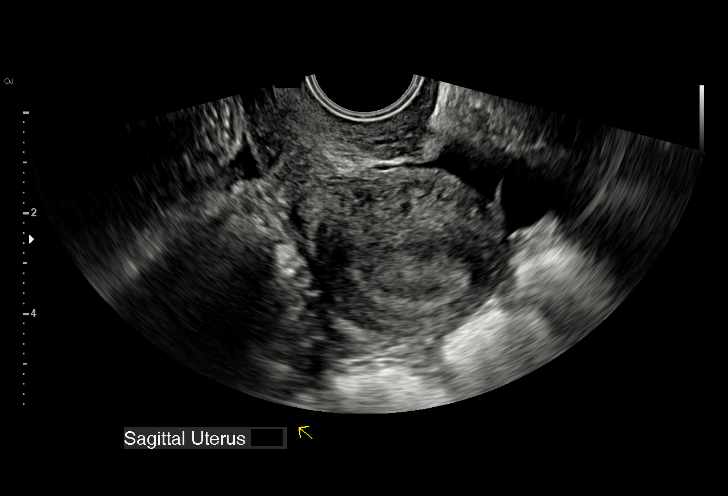
[im 28/63]
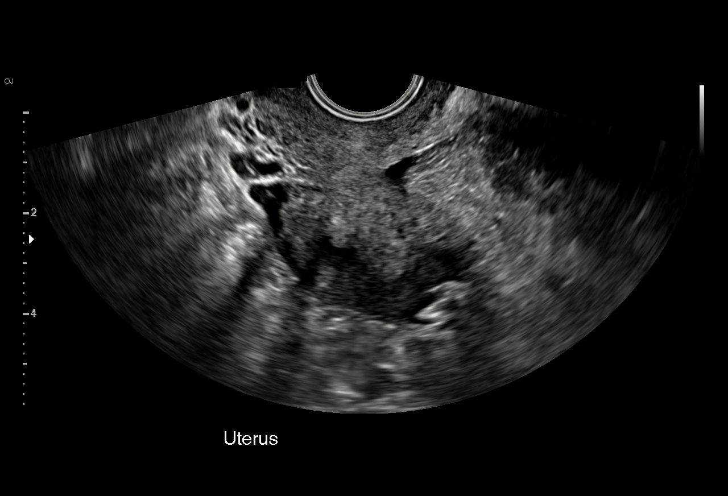
[im 33/63]
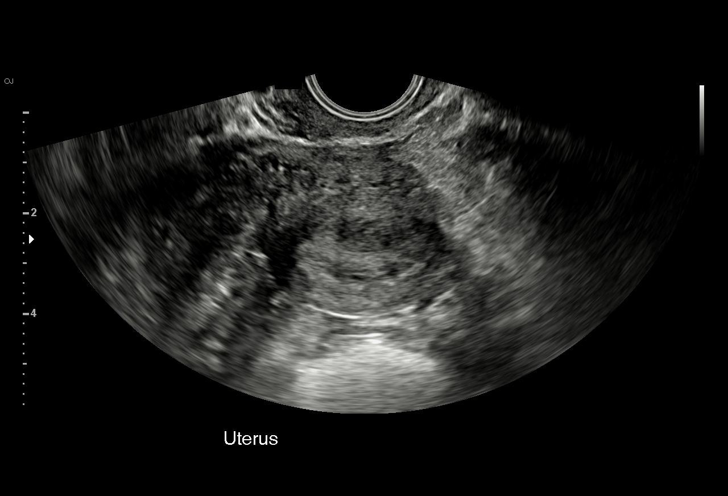
[im 35/63]
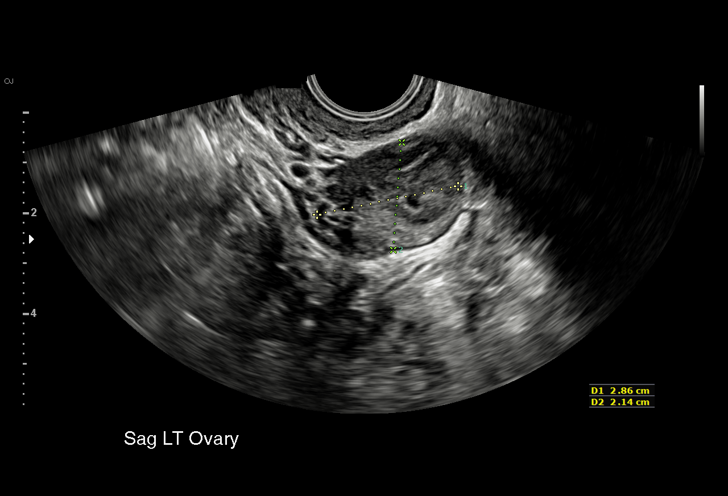
[im 40/63]
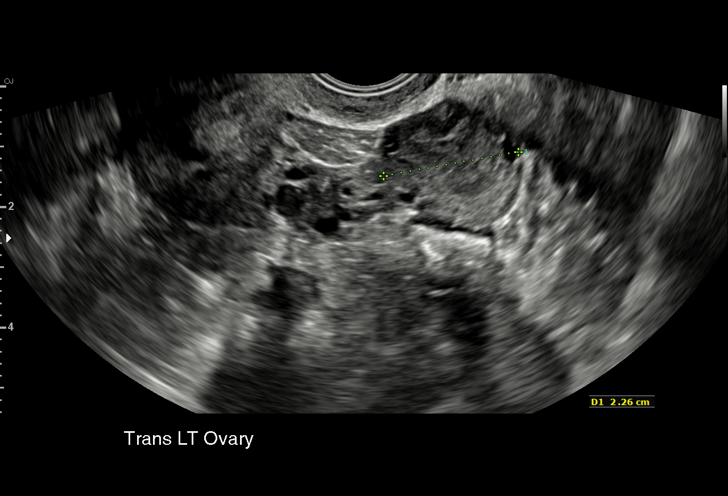
[im 44/63]
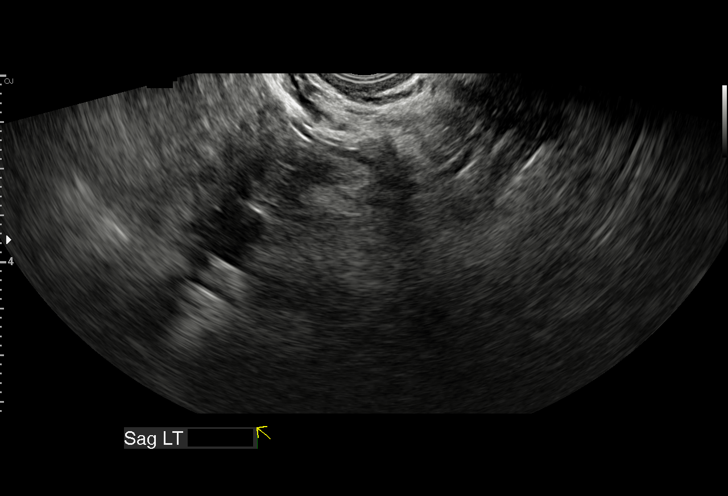
[im 49/63]
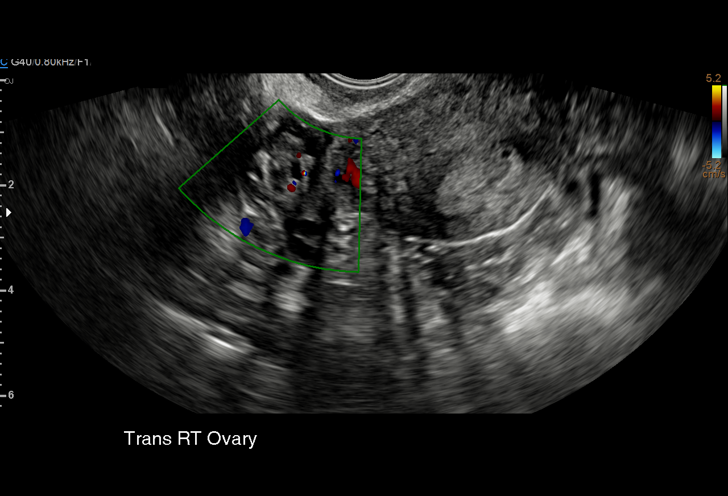
[im 53/63]
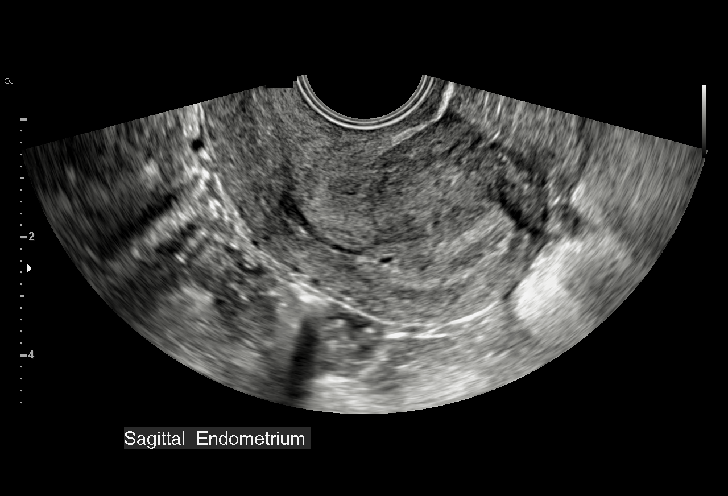
[im 58/63]
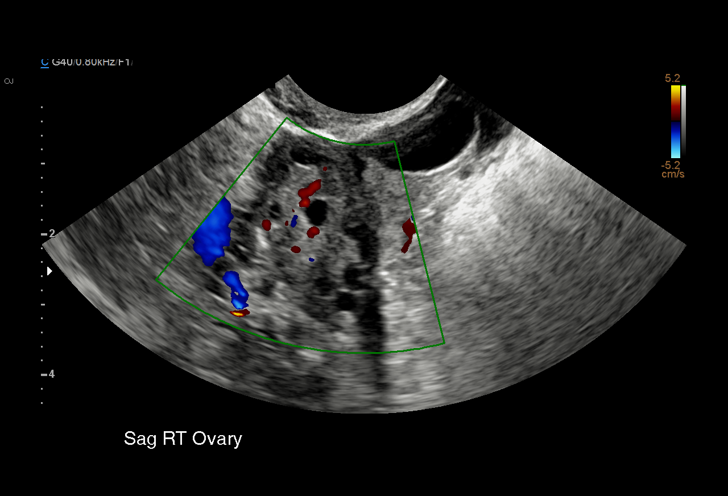
[im 63/63]
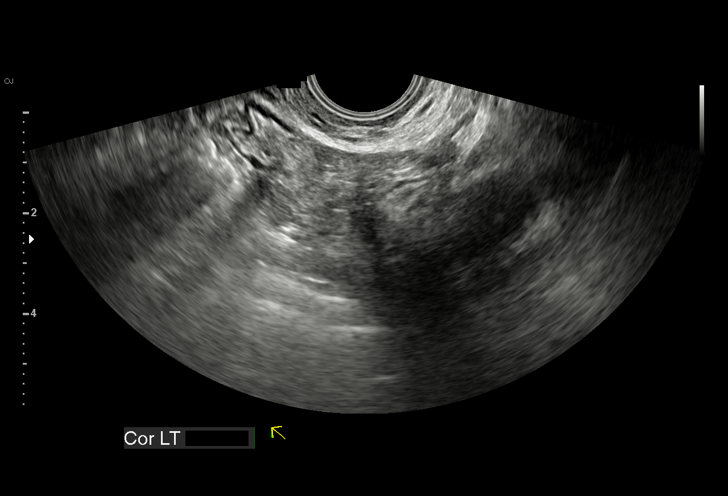

[15 of 28 positions shown; findings below may reference images not displayed]

FINDINGS: Intrauterine gestational sac: Not seen.

Yolk sac:  Not seen.

Embryo:  Not seen.

Maternal uterus/adnexae: Normal appearance of the ovaries.

Small amount of free fluid in the pelvis.
IMPRESSION: No intrauterine pregnancy identified.

Symmetric appearance of the ovaries.

Small amount of free fluid within the pelvis.

## 2017-11-27 DIAGNOSIS — F3181 Bipolar II disorder: Secondary | ICD-10-CM | POA: Diagnosis not present

## 2017-11-28 DIAGNOSIS — Z3685 Encounter for antenatal screening for Streptococcus B: Secondary | ICD-10-CM | POA: Diagnosis not present

## 2017-11-28 LAB — OB RESULTS CONSOLE GBS: GBS: POSITIVE

## 2017-11-28 IMAGING — US US OB TRANSVAGINAL
1 series · 15 of 28 positions shown · non-contrast
Comparison: 10/13/2016

CLINICAL DATA: Bleeding in early pregnancy. Lack of normal
elevation of beta HCG. Currently at 876. 6 weeks 6 days pregnant by
last menstrual.

EXAM:
OBSTETRIC <14 WK ULTRASOUND
TECHNIQUE: Transabdominal ultrasound was performed for evaluation of the
gestation as well as the maternal uterus and adnexal regions.

[Series 1: us ob transvaginal · 36 acquisitions, 15 frames shown]
[im 1/36]
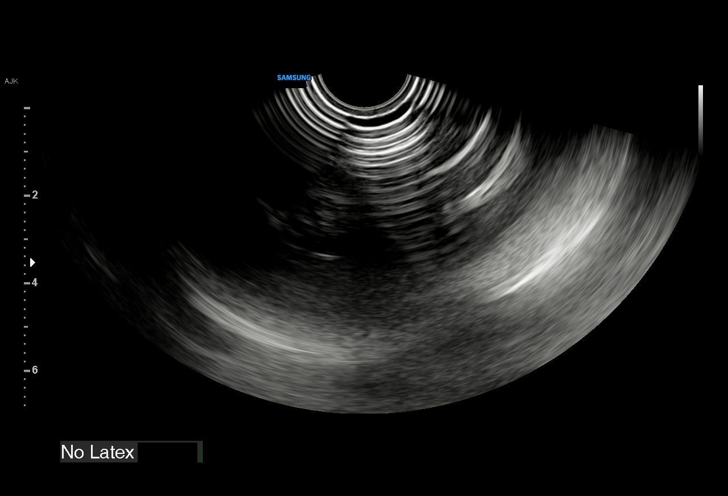
[im 3/36]
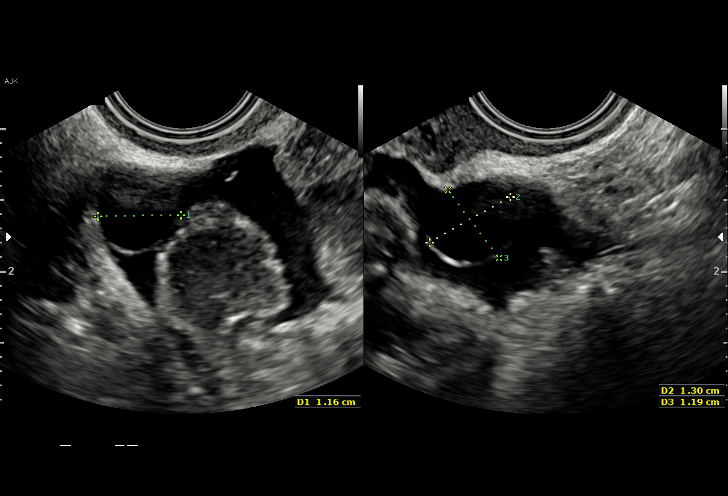
[im 6/36]
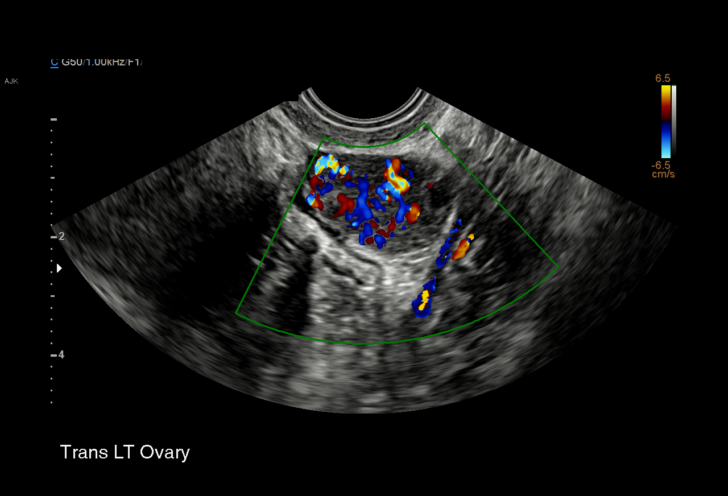
[im 8/36]
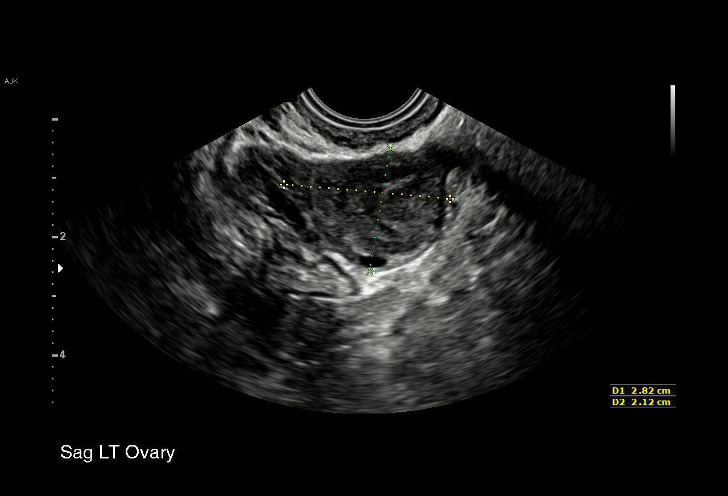
[im 11/36]
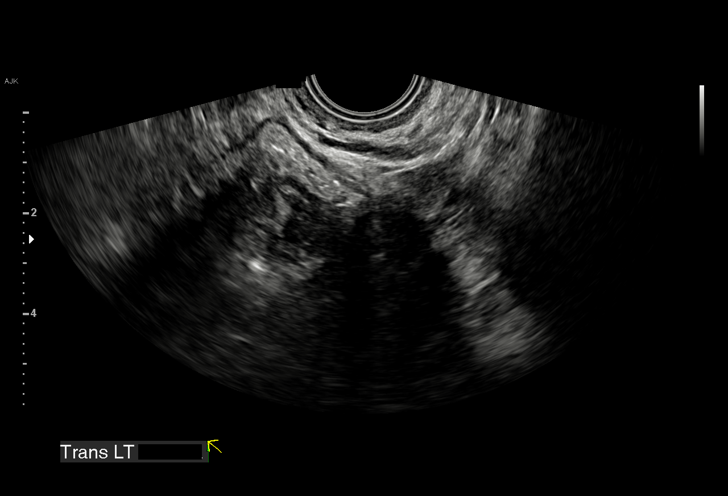
[im 13/36]
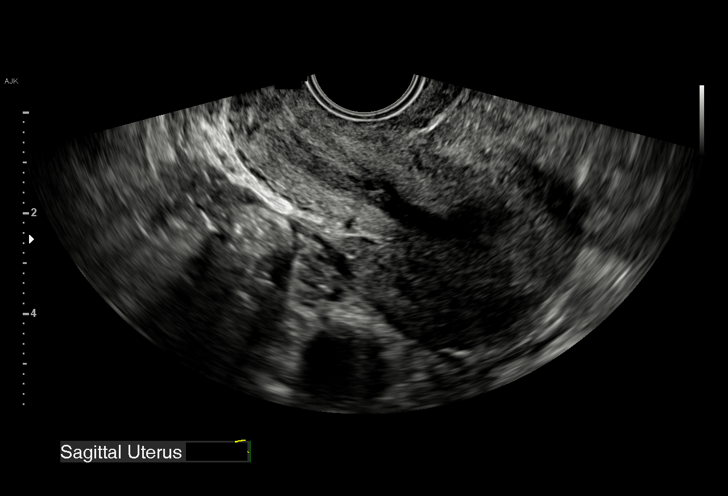
[im 16/36]
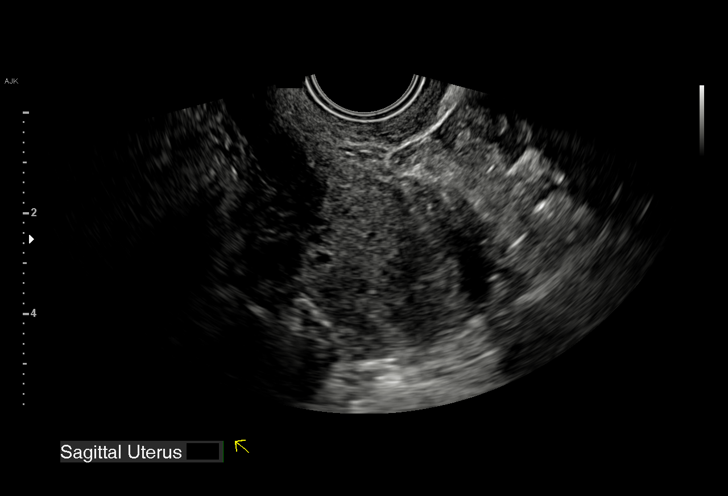
[im 19/36]
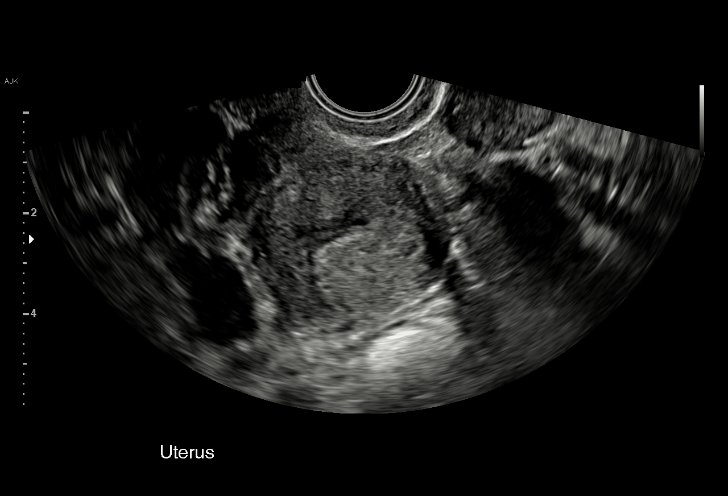
[im 20/36]
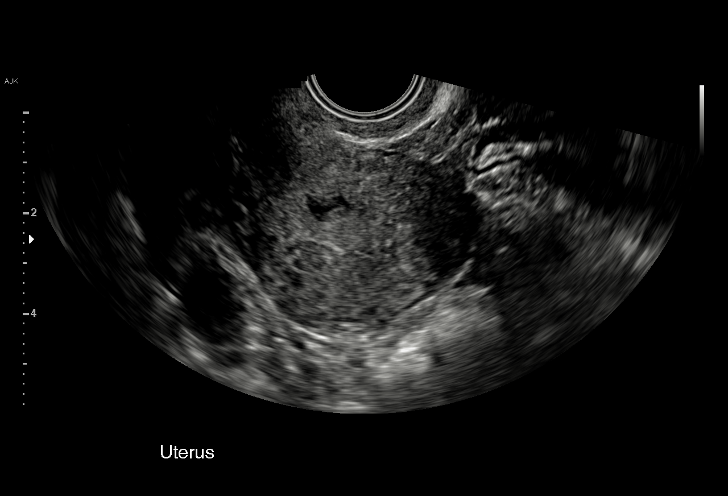
[im 23/36]
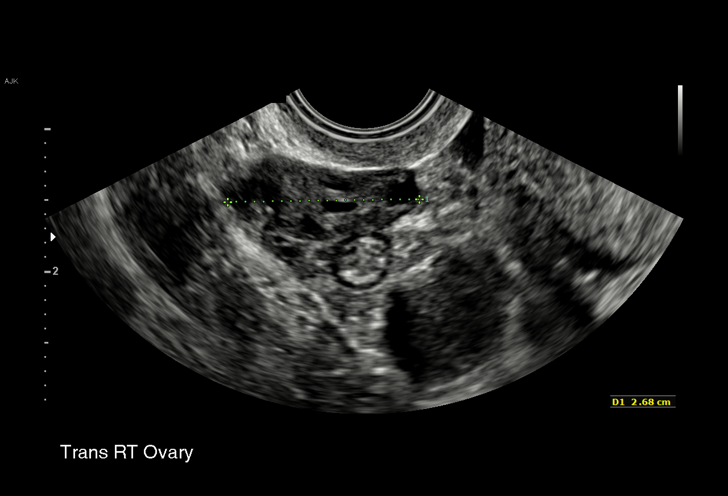
[im 25/36]
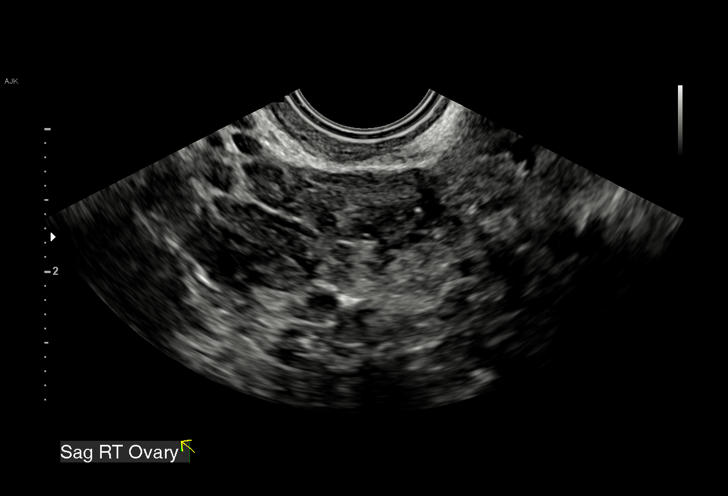
[im 28/36]
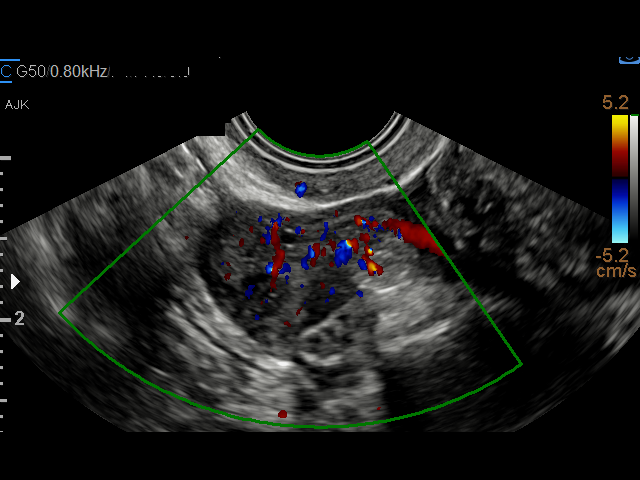
[im 30/36]
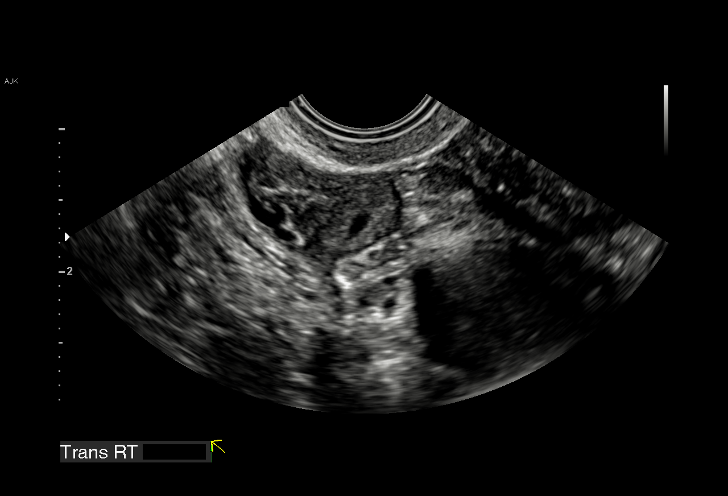
[im 33/36]
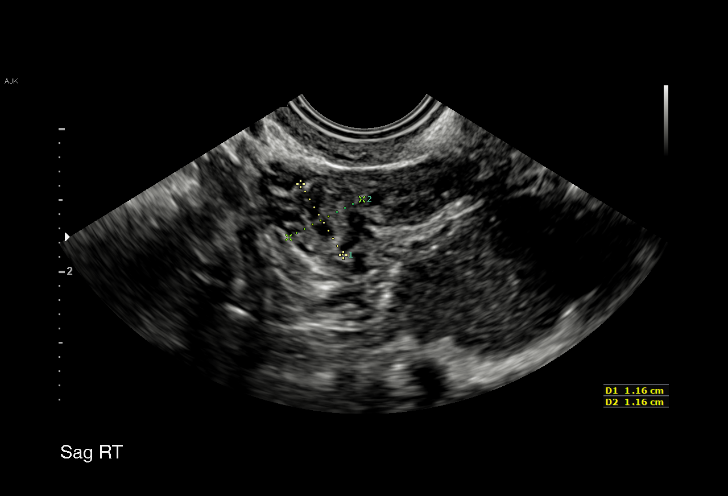
[im 36/36]
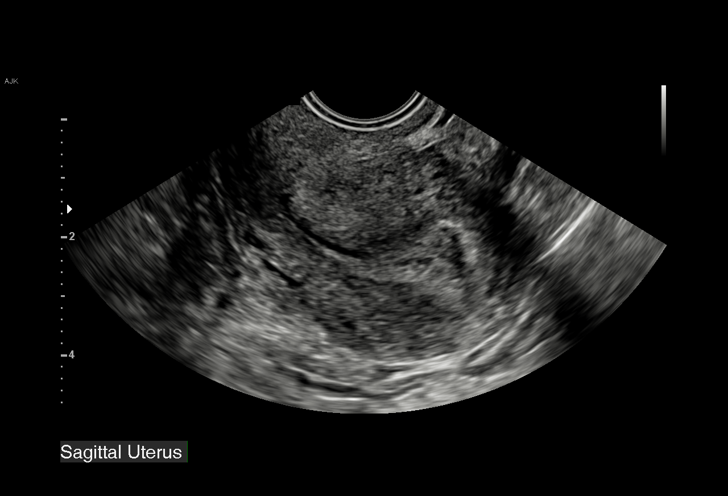

[15 of 28 positions shown; findings below may reference images not displayed]

FINDINGS: Intrauterine gestational sac: Absent

Yolk sac:  Absent

Embryo:  Absent

Cardiac Activity: Absent

Subchorionic hemorrhage:  None visualized.

Maternal uterus/adnexae: Hypervascularity in the left ovary likely
represents a a corpus luteal cyst. The left ovary measures 2.8 x
x 2.4 cm.

The right ovary measures 2.7 x 1.0 x 2.7 cm. Either exophytic off or
adjacent to the right ovary is a hypervascular structure which
measures 1.1 x 1.1 x 1.5 cm.
IMPRESSION: 1. No findings to suggest gestational sac, yolk sac, or fetal pole.
Considerations include early intrauterine pregnancy, missed
abortion, or ectopic pregnancy.
2. Left ovarian corpus luteal cyst.
3. Right pelvic lesion could represent exophytic corpus luteal cyst
or an extraovarian hypervascular structure such as an ectopic
pregnancy.

## 2017-12-04 ENCOUNTER — Telehealth (HOSPITAL_COMMUNITY): Payer: Self-pay | Admitting: *Deleted

## 2017-12-04 ENCOUNTER — Encounter (HOSPITAL_COMMUNITY): Payer: Self-pay | Admitting: *Deleted

## 2017-12-04 NOTE — Telephone Encounter (Signed)
Preadmission screen  

## 2017-12-09 ENCOUNTER — Inpatient Hospital Stay (HOSPITAL_COMMUNITY): Payer: BLUE CROSS/BLUE SHIELD | Admitting: Anesthesiology

## 2017-12-09 ENCOUNTER — Inpatient Hospital Stay (HOSPITAL_COMMUNITY)
Admission: AD | Admit: 2017-12-09 | Discharge: 2017-12-11 | DRG: 807 | Disposition: A | Discharge status: Home / Self Care | Payer: BLUE CROSS/BLUE SHIELD | Attending: Obstetrics and Gynecology | Admitting: Obstetrics and Gynecology

## 2017-12-09 ENCOUNTER — Encounter (HOSPITAL_COMMUNITY): Payer: Self-pay | Admitting: *Deleted

## 2017-12-09 DIAGNOSIS — O99824 Streptococcus B carrier state complicating childbirth: Principal | ICD-10-CM | POA: Diagnosis present

## 2017-12-09 DIAGNOSIS — Z3483 Encounter for supervision of other normal pregnancy, third trimester: Secondary | ICD-10-CM | POA: Diagnosis not present

## 2017-12-09 DIAGNOSIS — Z3A38 38 weeks gestation of pregnancy: Secondary | ICD-10-CM

## 2017-12-09 LAB — COMPREHENSIVE METABOLIC PANEL
ALT: 17 U/L (ref 0–44)
AST: 24 U/L (ref 15–41)
Albumin: 2.7 g/dL — ABNORMAL LOW (ref 3.5–5.0)
Alkaline Phosphatase: 213 U/L — ABNORMAL HIGH (ref 38–126)
Anion gap: 9 (ref 5–15)
BUN: 9 mg/dL (ref 6–20)
CO2: 21 mmol/L — ABNORMAL LOW (ref 22–32)
Calcium: 9.3 mg/dL (ref 8.9–10.3)
Chloride: 104 mmol/L (ref 98–111)
Creatinine, Ser: 0.75 mg/dL (ref 0.44–1.00)
GFR calc non Af Amer: >60 mL/min (ref >60)
Glucose, Bld: 81 mg/dL (ref 70–99)
Potassium: 4.1 mmol/L (ref 3.5–5.1)
SODIUM: 134 mmol/L — AB (ref 135–145)
Total Bilirubin: 0.3 mg/dL (ref 0.3–1.2)
Total Protein: 6 g/dL — ABNORMAL LOW (ref 6.5–8.1)

## 2017-12-09 LAB — CBC
HCT: 35 % — ABNORMAL LOW (ref 36.0–46.0)
Hemoglobin: 11.9 g/dL — ABNORMAL LOW (ref 12.0–15.0)
MCH: 29.5 pg (ref 26.0–34.0)
MCHC: 34 g/dL (ref 30.0–36.0)
MCV: 86.8 fL (ref 80.0–100.0)
PLATELETS: 189 10*3/uL (ref 150–400)
RBC: 4.03 MIL/uL (ref 3.87–5.11)
RDW: 13.7 % (ref 11.5–15.5)
WBC: 7.4 10*3/uL (ref 4.0–10.5)
nRBC: 0 % (ref 0.0–0.2)

## 2017-12-09 LAB — TYPE AND SCREEN
ABO/RH(D): A POS
Antibody Screen: NEGATIVE

## 2017-12-09 LAB — POCT FERN TEST: POCT FERN TEST: POSITIVE

## 2017-12-09 MED ORDER — FENTANYL 2.5 MCG/ML BUPIVACAINE 1/10 % EPIDURAL INFUSION (WH - ANES)
14.0000 mL/h | INTRAMUSCULAR | Status: DC | PRN
  Administered 2017-12-09: 14 mL/h via EPIDURAL
  Filled 2017-12-09: qty 100

## 2017-12-09 MED ORDER — SODIUM CHLORIDE 0.9 % IV SOLN
2.0000 g | Freq: Four times a day (QID) | INTRAVENOUS | Status: DC
  Administered 2017-12-09: 2 g via INTRAVENOUS
  Filled 2017-12-09: qty 2
  Filled 2017-12-09 (×2): qty 2000

## 2017-12-09 MED ORDER — ACETAMINOPHEN 325 MG PO TABS
650.0000 mg | ORAL_TABLET | ORAL | Status: DC | PRN
  Administered 2017-12-10: 650 mg via ORAL
  Filled 2017-12-09: qty 2

## 2017-12-09 MED ORDER — SOD CITRATE-CITRIC ACID 500-334 MG/5ML PO SOLN: 30.0000 mL | ORAL | Status: DC | PRN

## 2017-12-09 MED ORDER — OXYTOCIN 40 UNITS IN LACTATED RINGERS INFUSION - SIMPLE MED
2.5000 [IU]/h | INTRAVENOUS | Status: DC
  Filled 2017-12-09: qty 1000

## 2017-12-09 MED ORDER — FLEET ENEMA 7-19 GM/118ML RE ENEM: 1.0000 | ENEMA | RECTAL | Status: DC | PRN

## 2017-12-09 MED ORDER — OXYCODONE-ACETAMINOPHEN 5-325 MG PO TABS: 2.0000 | ORAL_TABLET | ORAL | Status: DC | PRN

## 2017-12-09 MED ORDER — LIDOCAINE HCL (PF) 1 % IJ SOLN
30.0000 mL | INTRAMUSCULAR | Status: DC | PRN
  Filled 2017-12-09: qty 30

## 2017-12-09 MED ORDER — DIPHENHYDRAMINE HCL 50 MG/ML IJ SOLN: 12.5000 mg | INTRAMUSCULAR | Status: DC | PRN

## 2017-12-09 MED ORDER — PHENYLEPHRINE 40 MCG/ML (10ML) SYRINGE FOR IV PUSH (FOR BLOOD PRESSURE SUPPORT)
80.0000 ug | PREFILLED_SYRINGE | INTRAVENOUS | Status: DC | PRN
  Filled 2017-12-09: qty 10

## 2017-12-09 MED ORDER — LACTATED RINGERS IV SOLN
500.0000 mL | Freq: Once | INTRAVENOUS | Status: AC
  Administered 2017-12-09: 500 mL via INTRAVENOUS

## 2017-12-09 MED ORDER — OXYCODONE-ACETAMINOPHEN 5-325 MG PO TABS: 1.0000 | ORAL_TABLET | ORAL | Status: DC | PRN

## 2017-12-09 MED ORDER — ONDANSETRON HCL 4 MG/2ML IJ SOLN: 4.0000 mg | Freq: Four times a day (QID) | INTRAMUSCULAR | Status: DC | PRN

## 2017-12-09 MED ORDER — EPHEDRINE 5 MG/ML INJ
10.0000 mg | INTRAVENOUS | Status: DC | PRN
  Filled 2017-12-09: qty 2

## 2017-12-09 MED ORDER — LACTATED RINGERS IV SOLN: INTRAVENOUS | Status: DC

## 2017-12-09 MED ORDER — OXYTOCIN BOLUS FROM INFUSION
500.0000 mL | Freq: Once | INTRAVENOUS | Status: AC
  Administered 2017-12-09: 500 mL via INTRAVENOUS

## 2017-12-09 MED ORDER — PHENYLEPHRINE 40 MCG/ML (10ML) SYRINGE FOR IV PUSH (FOR BLOOD PRESSURE SUPPORT)
80.0000 ug | PREFILLED_SYRINGE | INTRAVENOUS | Status: DC | PRN
  Filled 2017-12-09 (×2): qty 10

## 2017-12-09 MED ORDER — LACTATED RINGERS IV SOLN: 500.0000 mL | INTRAVENOUS | Status: DC | PRN

## 2017-12-09 MED ORDER — LIDOCAINE HCL (PF) 1 % IJ SOLN
INTRAMUSCULAR | Status: DC | PRN
  Administered 2017-12-09: 13 mL via EPIDURAL

## 2017-12-09 NOTE — H&P (Signed)
Melanie Oneill is a 27 y.o. female presenting for labor and SROM. 9cm dilated on admission to L&D. OB History    Gravida  2   Para      Term  0   Preterm      AB  1   Living        SAB      TAB      Ectopic  1   Multiple  0   Live Births             Past Medical History:  Diagnosis Date  . Abnormal Pap smear of cervix 07/21/2015   ASCUS cant rule out high grade  . Allergy   . Anxiety   . Depression   . Headache   . IBS (irritable bowel syndrome)   . Inverse psoriasis    Past Surgical History:  Procedure Laterality Date  . CHROMOPERTUBATION N/A 10/25/2016   Procedure: CHROMOPERTUBATION;  Surgeon: [name], Brook E, MD;  Location: [facility];  Service: Gynecology;  Laterality: N/A;  . COLPOSCOPY  08/2015   LGSIL  . DIAGNOSTIC LAPAROSCOPY WITH REMOVAL OF ECTOPIC PREGNANCY N/A 10/25/2016   Procedure: DIAGNOSTIC LAPAROSCOPY WITH REMOVAL OF ECTOPIC PREGNANCY;  Surgeon: [name], Brook E, MD;  Location: [facility];  Service: Gynecology;  Laterality: N/A;  . DILATION AND EVACUATION N/A 10/25/2016   Procedure: DILATATION AND EVACUATION;  Surgeon: [name], Brook E, MD;  Location: [facility];  Service: Gynecology;  Laterality: N/A;  . nexplanon insertion     10-22-13, removed 05-17-16  . TONSILLECTOMY    . UNILATERAL SALPINGECTOMY Right 10/25/2016   Procedure: UNILATERAL SALPINGECTOMY;  Surgeon: [name], Brook E, MD;  Location: [facility];  Service: Gynecology;  Laterality: Right;  . WISDOM TOOTH EXTRACTION     Family History: family history includes Asthma in her brother; Cancer in her father and maternal grandmother; Crohn's disease in her paternal grandfather; Hypertension in her father, paternal grandfather, and paternal grandmother. Social History:  reports that she has never smoked. She has never used smokeless tobacco. She reports that she does not drink alcohol or use drugs.     Maternal Diabetes: No Genetic Screening:  Normal Maternal Ultrasounds/Referrals: Normal Fetal Ultrasounds or other Referrals:  None Maternal Substance Abuse:  No Significant Maternal Medications:  None Significant Maternal Lab Results:  None Other Comments:  None  ROS History Dilation: 10 Effacement (%): 100 Station: Plus 1 Exam by:: Beitz RN Blood pressure 119/62, pulse 82, temperature 97.7 F (36.5 C), temperature source Oral, resp. rate 17, last menstrual period 03/16/2017, SpO2 100 %, unknown if currently breastfeeding. Exam Physical Exam  NAD, A&O NWOB Abd soft, nondistended, gravid  Prenatal labs: ABO, Rh: --/--/A POS (12/08 2128) Antibody: NEG (12/08 2128) Rubella: Immune (05/17 0000) RPR: Nonreactive (05/17 0000)  HBsAg: Negative (05/17 0000)  HIV: Non-reactive (05/17 0000)  GBS: Negative (11/17 0000)   Assessment/Plan: 27 yo G2P0 @ 38.2wga presenting in active labor and s/p SROM. Pregnancy uncomplicated. GBS positive in media records but negative in Epic. Will treat since unclear - give ampicillin given active labor. Pt w/uncontrollable urge to push - will start to push. Expecting female infant.    Ranae Pilalise Jennifer Lyric Hoar 12/09/2017, 10:42 PM

## 2017-12-09 NOTE — MAU Note (Signed)
Pt states her water broke around 2000 clear fluid slightly yellow with some spotting.  Contractions started shortly after, rating her pain an 8/10.  Reports good fetal movement.

## 2017-12-09 NOTE — Anesthesia Procedure Notes (Signed)
Epidural Patient location during procedure: OB Start time: 12/09/2017 9:58 PM End time: 12/09/2017 10:16 PM  Staffing Anesthesiologist: Lowella CurbMiller, Rindy Kollman Ray, MD Performed: anesthesiologist   Preanesthetic Checklist Completed: patient identified, site marked, surgical consent, pre-op evaluation, timeout performed, IV checked, risks and benefits discussed and monitors and equipment checked  Epidural Patient position: sitting Prep: ChloraPrep Patient monitoring: heart rate, cardiac monitor, continuous pulse ox and blood pressure Approach: midline Location: L2-L3 Injection technique: LOR saline  Needle:  Needle type: Tuohy  Needle gauge: 17 G Needle length: 9 cm Needle insertion depth: 6 cm Catheter type: closed end flexible Catheter size: 20 Guage Catheter at skin depth: 10 cm Test dose: negative  Assessment Events: blood not aspirated, injection not painful, no injection resistance, negative IV test and no paresthesia  Additional Notes Reason for block:procedure for pain

## 2017-12-09 NOTE — Anesthesia Preprocedure Evaluation (Signed)
Anesthesia Evaluation  Patient identified by MRN, date of birth, ID band Patient awake    Reviewed: Allergy & Precautions, NPO status , Patient's Chart, lab work & pertinent test results  Airway Mallampati: II  TM Distance: >3 FB Neck ROM: Full    Dental no notable dental hx.    Pulmonary neg pulmonary ROS,    Pulmonary exam normal breath sounds clear to auscultation       Cardiovascular negative cardio ROS Normal cardiovascular exam Rhythm:Regular Rate:Normal     Neuro/Psych  Headaches, Anxiety Depression negative psych ROS   GI/Hepatic negative GI ROS, Neg liver ROS,   Endo/Other  negative endocrine ROS  Renal/GU negative Renal ROS     Musculoskeletal negative musculoskeletal ROS (+)   Abdominal   Peds  Hematology negative hematology ROS (+)   Anesthesia Other Findings   Reproductive/Obstetrics (+) Pregnancy Missed AB                             Anesthesia Physical  Anesthesia Plan  ASA: II  Anesthesia Plan: Epidural   Post-op Pain Management:    Induction:   PONV Risk Score and Plan:   Airway Management Planned:   Additional Equipment:   Intra-op Plan:   Post-operative Plan:   Informed Consent: I have reviewed the patients History and Physical, chart, labs and discussed the procedure including the risks, benefits and alternatives for the proposed anesthesia with the patient or authorized representative who has indicated his/her understanding and acceptance.     Plan Discussed with: CRNA  Anesthesia Plan Comments:         Anesthesia Quick Evaluation

## 2017-12-10 ENCOUNTER — Other Ambulatory Visit: Payer: Self-pay

## 2017-12-10 ENCOUNTER — Encounter (HOSPITAL_COMMUNITY): Payer: Self-pay

## 2017-12-10 LAB — CBC
HCT: 27.8 % — ABNORMAL LOW (ref 36.0–46.0)
Hemoglobin: 9.4 g/dL — ABNORMAL LOW (ref 12.0–15.0)
MCH: 29.7 pg (ref 26.0–34.0)
MCHC: 33.8 g/dL (ref 30.0–36.0)
MCV: 87.7 fL (ref 80.0–100.0)
Platelets: 157 10*3/uL (ref 150–400)
RBC: 3.17 MIL/uL — ABNORMAL LOW (ref 3.87–5.11)
RDW: 14 % (ref 11.5–15.5)
WBC: 12.6 10*3/uL — ABNORMAL HIGH (ref 4.0–10.5)
nRBC: 0 % (ref 0.0–0.2)

## 2017-12-10 LAB — RPR: RPR Ser Ql: NONREACTIVE

## 2017-12-10 MED ORDER — OXYCODONE HCL 5 MG PO TABS: 5.0000 mg | ORAL_TABLET | ORAL | Status: DC | PRN

## 2017-12-10 MED ORDER — WITCH HAZEL-GLYCERIN EX PADS
1.0000 "application " | MEDICATED_PAD | CUTANEOUS | Status: DC | PRN
  Administered 2017-12-10: 1 via TOPICAL

## 2017-12-10 MED ORDER — ONDANSETRON HCL 4 MG PO TABS: 4.0000 mg | ORAL_TABLET | ORAL | Status: DC | PRN

## 2017-12-10 MED ORDER — IBUPROFEN 600 MG PO TABS
600.0000 mg | ORAL_TABLET | Freq: Four times a day (QID) | ORAL | Status: DC
  Administered 2017-12-10 – 2017-12-11 (×7): 600 mg via ORAL
  Filled 2017-12-10 (×7): qty 1

## 2017-12-10 MED ORDER — TETANUS-DIPHTH-ACELL PERTUSSIS 5-2.5-18.5 LF-MCG/0.5 IM SUSP: 0.5000 mL | Freq: Once | INTRAMUSCULAR | Status: DC

## 2017-12-10 MED ORDER — OXYCODONE HCL 5 MG PO TABS: 10.0000 mg | ORAL_TABLET | ORAL | Status: DC | PRN

## 2017-12-10 MED ORDER — BENZOCAINE-MENTHOL 20-0.5 % EX AERO
1.0000 "application " | INHALATION_SPRAY | CUTANEOUS | Status: DC | PRN
  Administered 2017-12-10: 1 via TOPICAL
  Filled 2017-12-10: qty 56

## 2017-12-10 MED ORDER — DIPHENHYDRAMINE HCL 25 MG PO CAPS: 25.0000 mg | ORAL_CAPSULE | Freq: Four times a day (QID) | ORAL | Status: DC | PRN

## 2017-12-10 MED ORDER — FLUOXETINE HCL 20 MG PO CAPS
40.0000 mg | ORAL_CAPSULE | Freq: Every day | ORAL | Status: DC
  Administered 2017-12-11: 40 mg via ORAL
  Filled 2017-12-10 (×3): qty 2

## 2017-12-10 MED ORDER — ONDANSETRON HCL 4 MG/2ML IJ SOLN: 4.0000 mg | INTRAMUSCULAR | Status: DC | PRN

## 2017-12-10 MED ORDER — ZOLPIDEM TARTRATE 5 MG PO TABS: 5.0000 mg | ORAL_TABLET | Freq: Every evening | ORAL | Status: DC | PRN

## 2017-12-10 MED ORDER — COCONUT OIL OIL
1.0000 "application " | TOPICAL_OIL | Status: DC | PRN
  Filled 2017-12-10: qty 120

## 2017-12-10 MED ORDER — ACETAMINOPHEN 325 MG PO TABS
650.0000 mg | ORAL_TABLET | ORAL | Status: DC | PRN
  Filled 2017-12-10: qty 2

## 2017-12-10 MED ORDER — DIBUCAINE 1 % RE OINT
1.0000 "application " | TOPICAL_OINTMENT | RECTAL | Status: DC | PRN
  Administered 2017-12-10: 1 via RECTAL
  Filled 2017-12-10: qty 28

## 2017-12-10 MED ORDER — SIMETHICONE 80 MG PO CHEW: 80.0000 mg | CHEWABLE_TABLET | ORAL | Status: DC | PRN

## 2017-12-10 MED ORDER — PRENATAL MULTIVITAMIN CH
1.0000 | ORAL_TABLET | Freq: Every day | ORAL | Status: DC
  Administered 2017-12-10 – 2017-12-11 (×2): 1 via ORAL
  Filled 2017-12-10 (×2): qty 1

## 2017-12-10 MED ORDER — SENNOSIDES-DOCUSATE SODIUM 8.6-50 MG PO TABS
2.0000 | ORAL_TABLET | ORAL | Status: DC
  Administered 2017-12-10 – 2017-12-11 (×2): 2 via ORAL
  Filled 2017-12-10 (×2): qty 2

## 2017-12-10 NOTE — Anesthesia Postprocedure Evaluation (Signed)
Anesthesia Post Note  Patient: Melanie Oneill  Procedure(s) Performed: AN AD HOC LABOR EPIDURAL     Patient location during evaluation: Mother Baby Anesthesia Type: Epidural Level of consciousness: awake and alert Pain management: pain level controlled Vital Signs Assessment: post-procedure vital signs reviewed and stable Respiratory status: spontaneous breathing, nonlabored ventilation and respiratory function stable Cardiovascular status: stable Postop Assessment: no headache, no backache, epidural receding, no apparent nausea or vomiting, patient able to bend at knees, adequate PO intake and able to ambulate Anesthetic complications: no    Last Vitals:  Vitals:   12/10/17 0219 12/10/17 0551  BP: 129/82 (!) 138/91  Pulse: 93 86  Resp: 18 18  Temp: 37.1 C 36.6 C  SpO2: 99% 99%    Last Pain:  Vitals:   12/10/17 0551  TempSrc: Oral  PainSc: 2    Pain Goal:                 Land O'LakesMalinova,[name]

## 2017-12-10 NOTE — Progress Notes (Signed)
Patient doing well. No complaints. BP (!) 138/91 (BP Location: Left Arm)   Pulse 86   Temp 97.9 F (36.6 C) (Oral)   Resp 18   LMP 03/16/2017   SpO2 99%   Breastfeeding? Unknown  Results for orders placed or performed during the hospital encounter of 12/09/17 (from the past 24 hour(s))  Comprehensive metabolic panel     Status: Abnormal   Collection Time: 12/09/17  9:24 PM  Result Value Ref Range   Sodium 134 (L) 135 - 145 mmol/L   Potassium 4.1 3.5 - 5.1 mmol/L   Chloride 104 98 - 111 mmol/L   CO2 21 (L) 22 - 32 mmol/L   Glucose, Bld 81 70 - 99 mg/dL   BUN 9 6 - 20 mg/dL   Creatinine, Ser 6.210.75 0.44 - 1.00 mg/dL   Calcium 9.3 8.9 - 30.810.3 mg/dL   Total Protein 6.0 (L) 6.5 - 8.1 g/dL   Albumin 2.7 (L) 3.5 - 5.0 g/dL   AST 24 15 - 41 U/L   ALT 17 0 - 44 U/L   Alkaline Phosphatase 213 (H) 38 - 126 U/L   Total Bilirubin 0.3 0.3 - 1.2 mg/dL   GFR calc non Af Amer >60 >60 mL/min   GFR calc Af Amer >60 >60 mL/min   Anion gap 9 5 - 15  CBC     Status: Abnormal   Collection Time: 12/09/17  9:24 PM  Result Value Ref Range   WBC 7.4 4.0 - 10.5 K/uL   RBC 4.03 3.87 - 5.11 MIL/uL   Hemoglobin 11.9 (L) 12.0 - 15.0 g/dL   HCT 65.735.0 (L) 84.636.0 - 96.246.0 %   MCV 86.8 80.0 - 100.0 fL   MCH 29.5 26.0 - 34.0 pg   MCHC 34.0 30.0 - 36.0 g/dL   RDW 95.213.7 84.111.5 - 32.415.5 %   Platelets 189 150 - 400 K/uL   nRBC 0.0 0.0 - 0.2 %  Type and screen [facility]     Status: None   Collection Time: 12/09/17  9:28 PM  Result Value Ref Range   ABO/RH(D) A POS    Antibody Screen NEG    Sample Expiration      12/12/2017 Performed at [facility], [address] [place], [place] [id number]   Crist FatFern Test     Status: None   Collection Time: 12/09/17  9:47 PM  Result Value Ref Range   POCT Fern Test Positive = ruptured amniotic membanes    Abdomen soft and non tender Lochia WNL PPD # 1  Doing well Routine care circ later today

## 2017-12-10 NOTE — Lactation Note (Signed)
This note was copied from a baby's chart. Lactation Consultation Note  Patient Name: Melanie Oneill [id number]'[id number]'s Date: 12/10/2017 Reason for consult: Initial assessment;1st time breastfeeding;Early term 37-38.6wks P1, 5 hour female infant. Per mom he BF in L&D. Per parents, infant had one stool and while LC in roon infant had another stool (Meconium) that she changed. Mom attempted to latch infant on left breast using cross cradle hold but infant was to sleepy to BF at this time. Mom has short shaft nipple and will be given breast shells by nurse.  Mom hand expressed 4 ml of colostrum that was given to infant by spoon. LC discussed I & O. Reviewed Baby & Me book's Breastfeeding Basics.  Mom knows to BF according hunger cues, 8 to 12 times within 24 hours and not exceed 3 hours without BF.  Mom will call Nurse or LC if she has any further questions, concerns or need assistance with latching  Infant to breast.  Mom made aware of O/P services, breastfeeding support groups, community resources, and our phone # for post-discharge questions.  Maternal Data Formula Feeding for Exclusion: No Has patient been taught Hand Expression?: Yes(Mom hand expressed 4 ml of colostrum that was spoon feed to infant.) Does the patient have breastfeeding experience prior to this delivery?: No  Feeding Feeding Type: Breast Fed  LATCH Score Latch: Too sleepy or reluctant, no latch achieved, no sucking elicited.  Audible Swallowing: None  Type of Nipple: Everted at rest and after stimulation(Short shafted )  Comfort (Breast/Nipple): Soft / non-tender  Hold (Positioning): Assistance needed to correctly position infant at breast and maintain latch.  LATCH Score: 5  Interventions Interventions: Breast feeding basics reviewed;Assisted with latch;Skin to skin;Adjust position;Breast compression;Support pillows;Position options;Shells  Lactation Tools Discussed/Used Tools: Shells(Has short shaft nipples.) WIC  Program: No   Consult Status Consult Status: Follow-up Date: 12/10/17 Follow-up type: In-patient    Danelle EarthlyRobin Zebediah Beezley 12/10/2017, 4:47 AM

## 2017-12-10 NOTE — Progress Notes (Signed)
Pt ordered Prozac 40mg  to be given at bedside. [facility] Pt stated she already took her own before coming to hospital. meds had already been opened. Called pharmacy they said just to throw them away or dispose of them. Since they were not a scheduled substance they could be disposed of and did not need to be wasted. Disposed of meds in sink drain with charge nurse Tia.   Darrick GrinderErin Mel Tadros RN

## 2017-12-10 NOTE — Progress Notes (Signed)
MOB was referred for history of depression/anxiety. * Referral screened out by Clinical Social Worker because none of the following criteria appear to apply: ~ History of anxiety/depression during this pregnancy, or of post-partum depression following prior delivery. ~ Diagnosis of anxiety and/or depression within last 3 years OR * MOB's symptoms currently being treated with medication and/or therapy. Please contact the Clinical Social Worker if needs arise, by MOB request, or if MOB scores greater than 9/yes to question 10 on Edinburgh Postpartum Depression Screen.  Welborn Keena, LCSW Clinical Social Worker  [facility] [employer]  [phone]  

## 2017-12-10 NOTE — Lactation Note (Signed)
This note was copied from a baby's chart. Lactation Consultation Note  Patient Name: Melanie Oneill ZHYQM'VToday's Date: 12/10/2017 Reason for consult: Initial assessment;1st time breastfeeding;Early term 37-38.6wks P1, 6 hour  female ETI  with c/s delivery.  Per mom, she has DEBP at home. Per mom, she  Is a  [facility] patient and has donor milk available to supplement infant if needed. Mom doesn't wan to pump at this time only prefers to latch infant to breast and use donor milk available for supplement if needed.  Mom BF  Infant for 15 minutes prior to [facility] entering room. LC did not observe latch at this time. Mom demonstrated hand expression and gave infant 5 ml of colostrum on spoon. Mom was glad to see that she had milk to give to her baby. Mom will BF infant according hunger cues and not exceed 3 hours without BF infant. Mom will BF first and then supplement with donor milk after feeds at breast. LC discussed I & O. Reviewed Baby & Me book's Breastfeeding Basics. Mom knows to call Nurse or LC if she has any further questions or concerns and if she needs assistance with BF.   Mom made aware of O/P services, breastfeeding support groups, community resources, and our phone # for post-discharge questions.     Maternal Data Formula Feeding for Exclusion: No Has patient been taught Hand Expression?: Yes(Mom hand expressed 5 ml of colostrum that was spoon feed to infant.) Does the patient have breastfeeding experience prior to this delivery?: No  Feeding Feeding Type: Breast Fed  LATCH Score           LATCH Score: 5  Interventions Interventions: Breast feeding basics reviewed;Assisted with latch;Skin to skin;Adjust position;Breast compression;Support pillows;Position options;Shells  Lactation Tools Discussed/Used Tools: Shells(Has short shaft nipples.) WIC Program: No   Consult Status Consult Status: Follow-up Date: 12/10/17 Follow-up type: In-patient    Danelle EarthlyRobin  Quin Mcpherson 12/10/2017, 5:33 AM

## 2017-12-11 NOTE — Plan of Care (Signed)
Patient appropriate for discharge.

## 2017-12-11 NOTE — Discharge Summary (Signed)
Obstetric Discharge Summary Reason for Admission: onset of labor Prenatal Procedures: none Intrapartum Procedures: spontaneous vaginal delivery Postpartum Procedures: none Complications-Operative and Postpartum: 2 degree perineal laceration Hemoglobin  Date Value Ref Range Status  12/10/2017 9.4 (L) 12.0 - 15.0 g/dL Final    Comment:    DELTA CHECK NOTED REPEATED TO VERIFY   11/08/2016 12.5 11.1 - 15.9 g/dL Final   Hemoglobin, fingerstick  Date Value Ref Range Status  06/26/2013 12.6 12.0 - 16.0 g/dL Final   HCT  Date Value Ref Range Status  12/10/2017 27.8 (L) 36.0 - 46.0 % Final   Hematocrit  Date Value Ref Range Status  11/08/2016 37.4 34.0 - 46.6 % Final    Physical Exam:  General: alert, cooperative, appears stated age and no distress Lochia: appropriate Uterine Fundus: firm Incision: healing well DVT Evaluation: No evidence of DVT seen on physical exam.  Discharge Diagnoses: Term Pregnancy-delivered  Discharge Information: Date: 12/11/2017 Activity: pelvic rest Diet: routine Medications: None Condition: stable Instructions: refer to practice specific booklet Discharge to: home   Newborn Data: Live born female  Birth Weight: 8 lb 9.9 oz (3909 g) APGAR: 8, 8  Newborn Delivery   Birth date/time:  12/09/2017 22:56:00 Delivery type:  Vaginal, Spontaneous     Home with NICU for Abx and observation.  Turner Danielsavid C Toniya Rozar 12/11/2017, 8:54 AM

## 2017-12-11 NOTE — Lactation Note (Signed)
This note was copied from a baby's chart. Lactation Consultation Note  Patient Name: Melanie Oneill [id number] Date: 12/11/2017 Reason for consult: Follow-up assessment;1st time breastfeeding;Primapara;Early term 37-38.6wks;NICU baby;Infant weight loss  Baby is 12 hour old  Per mom last fed at 7am to the breast and its the best  "[name]" has done.  Per mom has pumped with the DEBP several times since midnight with still small  Amount.  LC reviewed supply and demand and praised mom for her consistent pumping and reassured her consistent pumping / latching / and hand expressing with enhance the  Let down. Per mom the #24 F is comfortable.  LC recommended since mom is going home today to plan on taking her DEBP kit and  Pumping while visiting the baby in NICU. Consistent pumping is 8-10 x's a day both breast.  Mom denies soreness. Sore nipple and engorgement prevention and tx reviewed.  Per mom has a Spectra 2 at home.  LC also instructed mom on the use of a hand pump.  Reviewed storage of EBM for NICU and when she goes home.  Provided her with the NICU booklet and storage containers.  Mother informed of post-discharge support and given phone number to the lactation department, including services for phone call assistance; out-patient appointments; and breastfeeding support group. List of other breastfeeding resources in the community given in the handout. Encouraged mother to call for problems or concerns related to breastfeeding.  LC encouraged mom to stop at the nurses station to have the secretary call [place] to meet her up in NICU for latch assist .     Maternal Data Has patient been taught Hand Expression?: Yes(per mom )  Feeding Feeding Type: (per mom last fed at 7 am in NICU )  LATCH Score                   Interventions Interventions: Breast feeding basics reviewed;DEBP  Lactation Tools Discussed/Used Tools: Pump Nipple shield size: (per mom did not have to use the  NS ) Breast pump type: Double-Electric Breast Pump Pump Review: Milk Storage(already set up )   Consult Status Consult Status: PRN Date: (in  NICU ) Follow-up type: Other (comment)(baby in NICU )    Clifford 12/11/2017, 10:13 AM

## 2017-12-11 NOTE — Plan of Care (Signed)
Progressing appropriately. Encouraged to call for assistance as needed. Will call when ready to have pump set up. Would like to rest for now.

## 2017-12-11 NOTE — Progress Notes (Signed)
Post Partum Day 2 Subjective: no complaints, up ad lib, voiding and tolerating PO  Objective: Blood pressure 130/74, pulse 75, temperature 97.6 F (36.4 C), temperature source Oral, resp. rate 18, last menstrual period 03/16/2017, SpO2 99 %, unknown if currently breastfeeding.  Physical Exam:  General: alert, cooperative, appears stated age and no distress Lochia: appropriate Uterine Fundus: firm Incision: healing well DVT Evaluation: No evidence of DVT seen on physical exam.  Recent Labs    12/09/17 2124 12/10/17 0619  HGB 11.9* 9.4*  HCT 35.0* 27.8*    Assessment/Plan: Discharge home and Breastfeeding  Baby in NICU Will circ when able per NICU   LOS: 2 days   Melanie DanielsDavid C Oneill Melanie Oneill 12/11/2017, 8:51 AM

## 2017-12-11 NOTE — Lactation Note (Signed)
This note was copied from a baby'Oneill chart. Lactation Consultation Note  Patient Name: [name] Date: 12/11/2017 Reason for consult: Follow-up assessment;NICU baby Pecola LeisureBaby is 36 hours and at a 9% weight loss.  Called to NICU to assist with latch.  Baby sleeping in mom'Oneill arms.  Waking techniques done and baby positioned skin to skin in football hold.  Mom easily hand expressed drop of colostrum.  Baby very sleepy and not showing interest in feeding.  Baby has a strong suck on pacifier but won't open mouth to latch.  Instructed to call for assist with feeding cues.  Mom will continue to pump and hand express every 3 hours.  Maternal Data Has patient been taught Hand Expression?: Yes(per mom )  Feeding Feeding Type: Breast Fed  LATCH Score Latch: Too sleepy or reluctant, no latch achieved, no sucking elicited.  Audible Swallowing: None  Type of Nipple: Everted at rest and after stimulation  Comfort (Breast/Nipple): Soft / non-tender  Hold (Positioning): Assistance needed to correctly position infant at breast and maintain latch.  LATCH Score: 5  Interventions Interventions: Assisted with latch;Breast compression;Skin to skin;Adjust position;Breast massage;Support pillows;Hand express;Position options;DEBP  Lactation Tools Discussed/Used Tools: Pump Nipple shield size: (per mom did not have to use the NS ) Breast pump type: Double-Electric Breast Pump Pump Review: Milk Storage(already set up )   Consult Status Consult Status: PRN Date: (in  NICU ) Follow-up type: Other (comment)(baby in NICU )    Huston FoleyMOULDEN, Melanie Oneill 12/11/2017, 11:09 AM

## 2017-12-14 ENCOUNTER — Inpatient Hospital Stay (HOSPITAL_COMMUNITY): Admission: RE | Admit: 2017-12-14 | Payer: BLUE CROSS/BLUE SHIELD | Source: Ambulatory Visit

## 2018-01-22 DIAGNOSIS — Z1389 Encounter for screening for other disorder: Secondary | ICD-10-CM | POA: Diagnosis not present

## 2018-07-15 DIAGNOSIS — Z20828 Contact with and (suspected) exposure to other viral communicable diseases: Secondary | ICD-10-CM | POA: Diagnosis not present

## 2018-11-26 IMAGING — US US MFM OB LIMITED
1 series · 15 of 18 positions shown · non-contrast
Comparison: none

[Series 1: us mfm ob limited · 18 acquisitions, 15 frames shown]
[im 1/18]
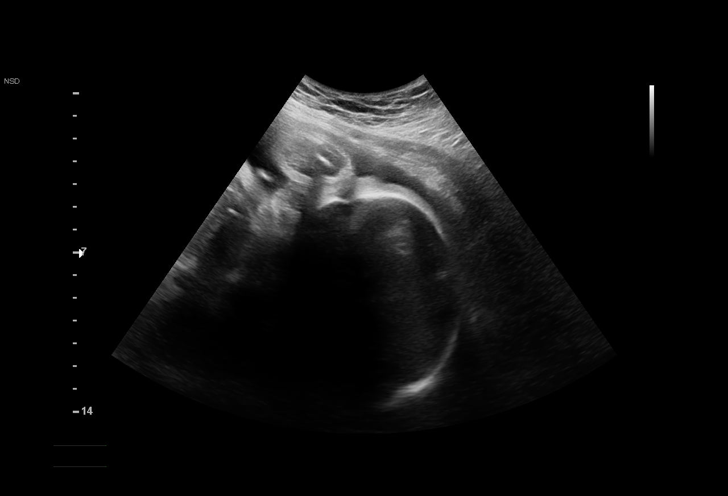
[im 2/18]
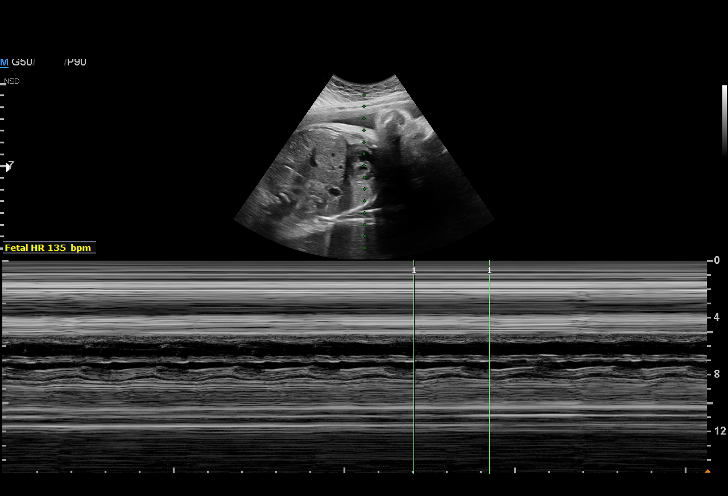
[im 4/18]
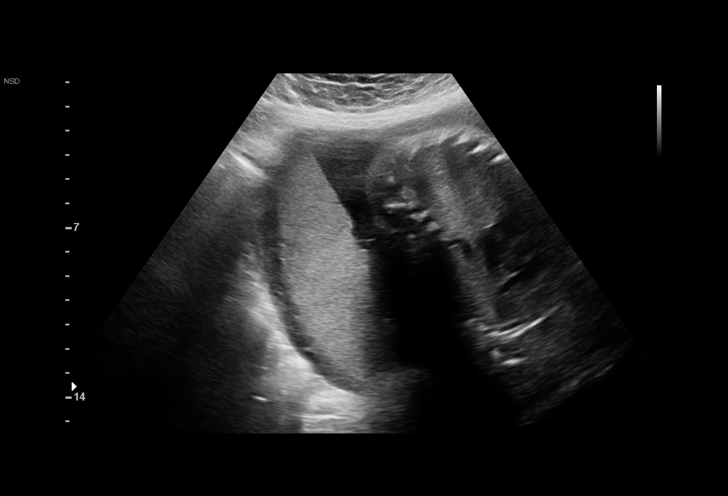
[im 5/18]
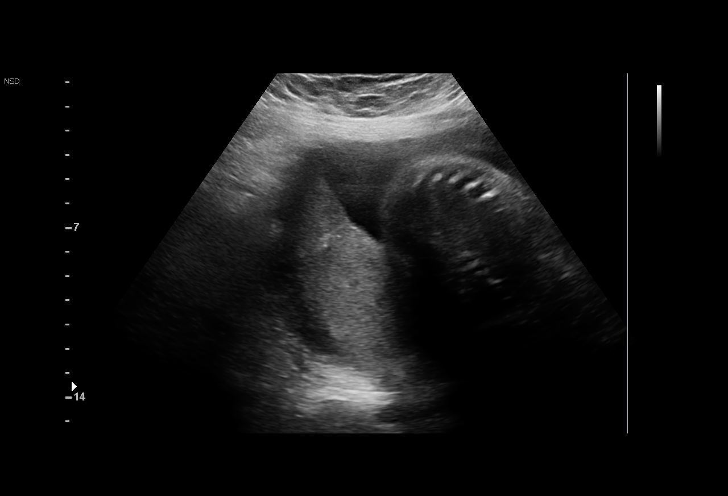
[im 6/18]
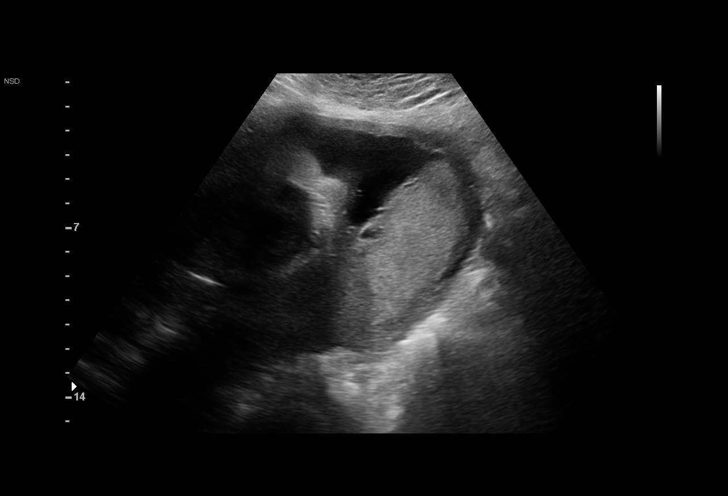
[im 7/18]
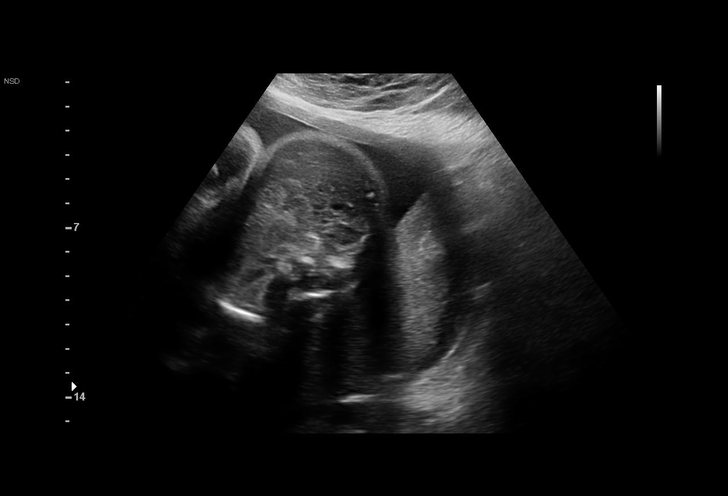
[im 8/18]
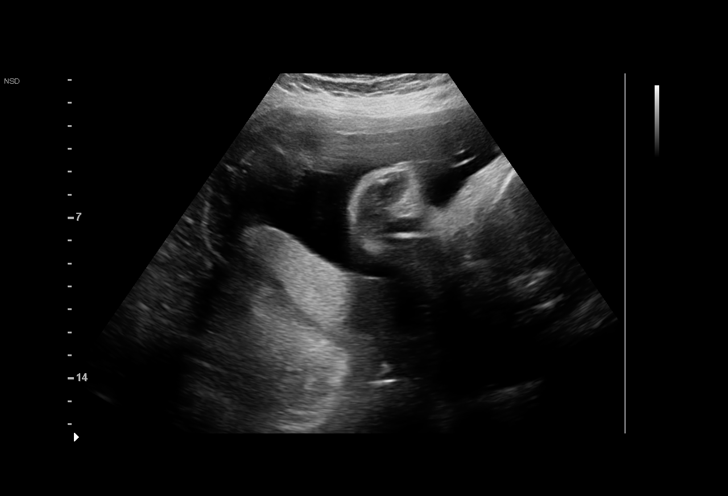
[im 10/18]
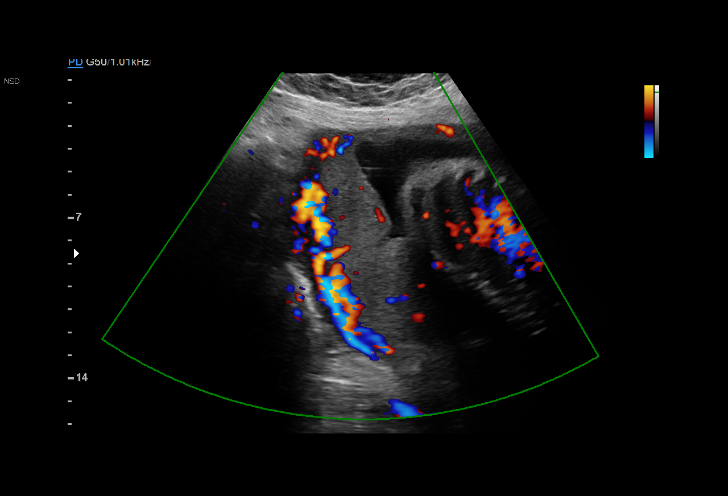
[im 11/18]
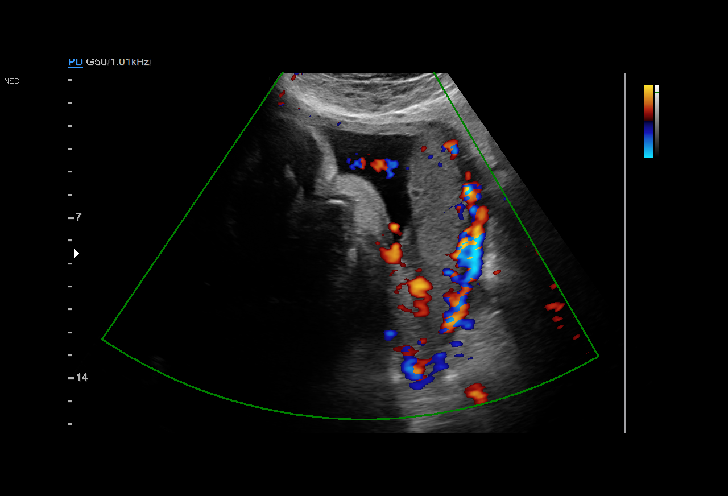
[im 12/18]
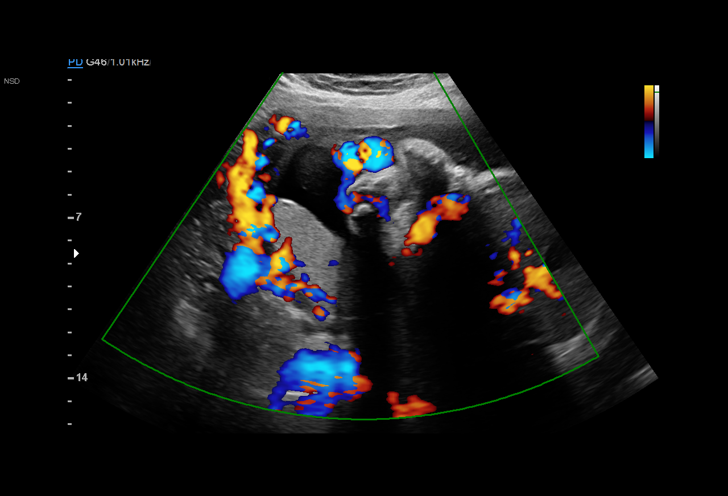
[im 13/18]
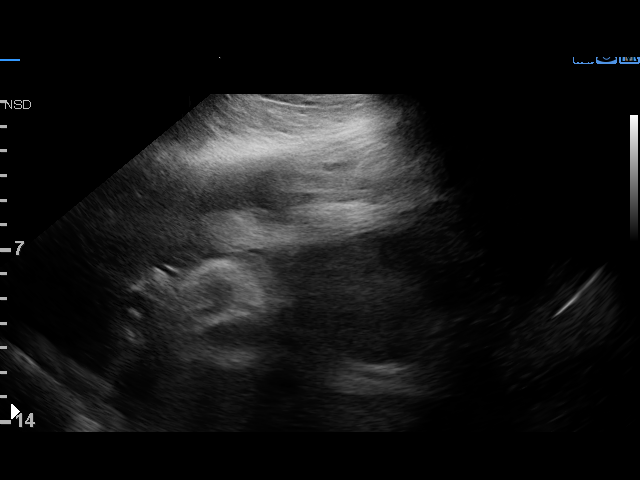
[im 14/18]
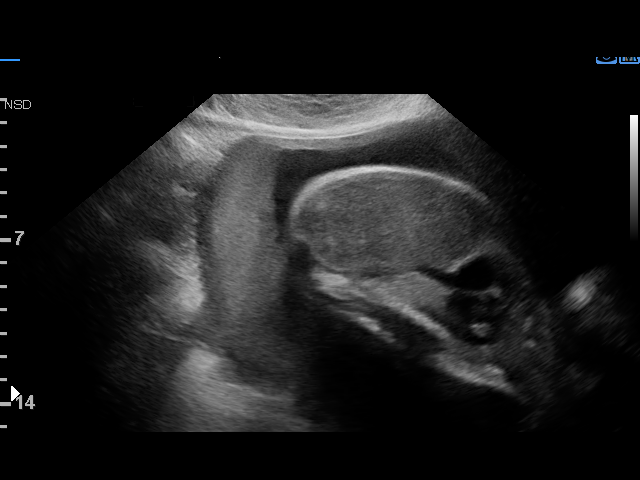
[im 16/18]
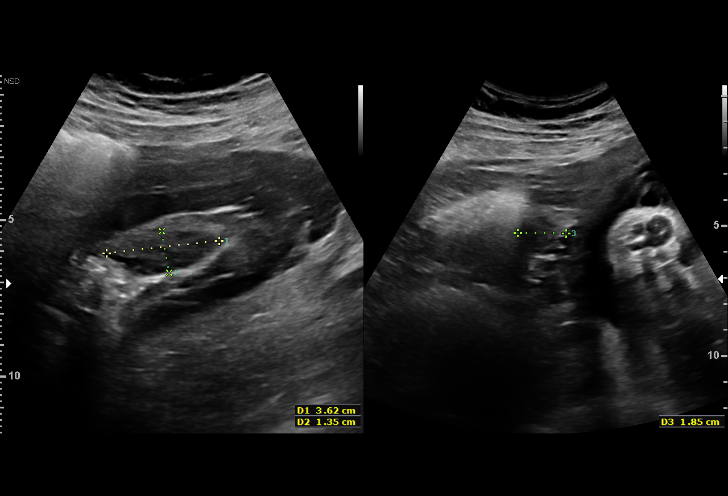
[im 17/18]
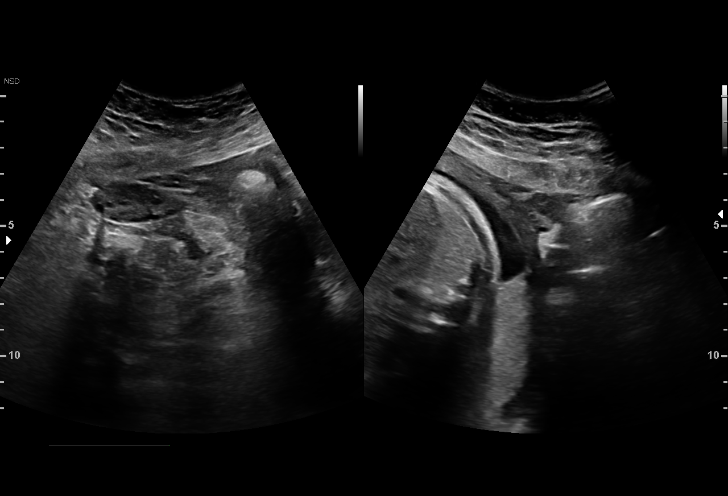
[im 18/18]
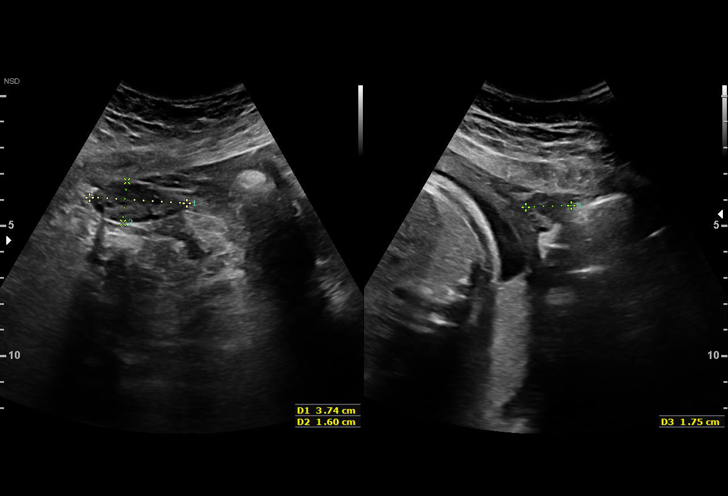

[15 of 18 positions shown; findings below may reference images not displayed]

MAU/Triage
HAYEN

1  US MFM OB LIMITED                    76815.01     SOMMERVIL
HAYEN

Indications

Pelvic pain affecting pregnancy in third
trimester (LLQ)
30 weeks gestation of pregnancy
Fetal Evaluation

Num Of Fetuses:          1
Fetal Heart Rate(bpm):   135
Cardiac Activity:        Observed
Presentation:            Cephalic
Placenta:                Posterior
P. Cord Insertion:       Not well visualized

Amniotic Fluid
AFI FV:      Within normal limits

AFI Sum(cm)     %Tile       Largest Pocket(cm)
21.22           83

RUQ(cm)       RLQ(cm)       LUQ(cm)        LLQ(cm)
3.73
OB History

Gravidity:    2         Term:   0        Prem:   0        SAB:   0
TOP:          0       Ectopic:  1        Living: 0
Gestational Age

LMP:           30w 3d        Date:  03/16/17                 EDD:   12/21/17
Best:          30w 3d     Det. By:  LMP  (03/16/17)          EDD:   12/21/17
Cervix Uterus Adnexa

Cervix
Not visualized (advanced GA >99wks)

Left Ovary
Within normal limits.

Right Ovary
Within normal limits.

Adnexa
No abnormality visualized.
Impression

Normal amniotic fluid volume
Recommendations

Follow up as clinically indicated.

## 2018-12-06 DIAGNOSIS — Z6825 Body mass index (BMI) 25.0-25.9, adult: Secondary | ICD-10-CM | POA: Diagnosis not present

## 2018-12-06 DIAGNOSIS — J069 Acute upper respiratory infection, unspecified: Secondary | ICD-10-CM | POA: Diagnosis not present

## 2019-02-03 ENCOUNTER — Telehealth: Payer: Self-pay

## 2019-02-03 NOTE — Telephone Encounter (Signed)
Patient is in 08 recall for previous abnormal pap history (ASCUS H). Last pap with [place] was 07-20-16 neg HPV HR neg. Patient had delivery of son & was discharged 12-09-17. Left patient a message to see if she would like to schedule her aex with [place].

## 2019-02-05 DIAGNOSIS — M25511 Pain in right shoulder: Secondary | ICD-10-CM | POA: Diagnosis not present

## 2019-02-10 DIAGNOSIS — L400 Psoriasis vulgaris: Secondary | ICD-10-CM | POA: Diagnosis not present

## 2019-02-11 NOTE — Telephone Encounter (Signed)
No callback or appointment scheduled. Routing to nursing supervisor [employer]

## 2019-02-25 NOTE — Telephone Encounter (Signed)
Patient removed from recall.  Cc: Leota Sauers CNM  Cornelia Copa, CMA

## 2019-02-25 NOTE — Telephone Encounter (Signed)
Okay to remove from recall.

## 2019-02-25 NOTE — Telephone Encounter (Signed)
Spoke with patient. Patient states that she is continuing her care with her OBGYN provider Dr.Grewal. Aware of the importance of her having another pap smear. Patient wants to have this done at her OBGYN office. Patient will contact our office if she needs any records transferred.  Dr.Jertson, Okay to remove patient from recall?

## 2019-03-10 DIAGNOSIS — N912 Amenorrhea, unspecified: Secondary | ICD-10-CM | POA: Diagnosis not present

## 2019-03-12 DIAGNOSIS — N912 Amenorrhea, unspecified: Secondary | ICD-10-CM | POA: Diagnosis not present

## 2019-03-17 ENCOUNTER — Encounter: Payer: Self-pay | Admitting: Certified Nurse Midwife

## 2019-03-19 ENCOUNTER — Encounter: Payer: Self-pay | Admitting: Certified Nurse Midwife

## 2019-03-28 DIAGNOSIS — N911 Secondary amenorrhea: Secondary | ICD-10-CM | POA: Diagnosis not present

## 2019-04-03 DIAGNOSIS — H6000 Abscess of external ear, unspecified ear: Secondary | ICD-10-CM | POA: Diagnosis not present

## 2019-04-03 DIAGNOSIS — H60332 Swimmer's ear, left ear: Secondary | ICD-10-CM | POA: Diagnosis not present

## 2019-04-08 DIAGNOSIS — H60312 Diffuse otitis externa, left ear: Secondary | ICD-10-CM | POA: Diagnosis not present

## 2019-04-17 DIAGNOSIS — Z3481 Encounter for supervision of other normal pregnancy, first trimester: Secondary | ICD-10-CM | POA: Diagnosis not present

## 2019-04-17 DIAGNOSIS — Z3685 Encounter for antenatal screening for Streptococcus B: Secondary | ICD-10-CM | POA: Diagnosis not present

## 2019-04-18 DIAGNOSIS — Z3481 Encounter for supervision of other normal pregnancy, first trimester: Secondary | ICD-10-CM | POA: Diagnosis not present

## 2019-04-18 DIAGNOSIS — Z113 Encounter for screening for infections with a predominantly sexual mode of transmission: Secondary | ICD-10-CM | POA: Diagnosis not present

## 2019-04-18 DIAGNOSIS — O219 Vomiting of pregnancy, unspecified: Secondary | ICD-10-CM | POA: Diagnosis not present

## 2019-04-18 DIAGNOSIS — Z124 Encounter for screening for malignant neoplasm of cervix: Secondary | ICD-10-CM | POA: Diagnosis not present

## 2019-04-18 DIAGNOSIS — Z331 Pregnant state, incidental: Secondary | ICD-10-CM | POA: Diagnosis not present

## 2019-05-05 DIAGNOSIS — Z3A12 12 weeks gestation of pregnancy: Secondary | ICD-10-CM | POA: Diagnosis not present

## 2019-05-05 DIAGNOSIS — Z3682 Encounter for antenatal screening for nuchal translucency: Secondary | ICD-10-CM | POA: Diagnosis not present

## 2019-06-18 DIAGNOSIS — Z3A19 19 weeks gestation of pregnancy: Secondary | ICD-10-CM | POA: Diagnosis not present

## 2019-06-18 DIAGNOSIS — Z1371 Encounter for nonprocreative screening for genetic disease carrier status: Secondary | ICD-10-CM | POA: Diagnosis not present

## 2019-06-18 DIAGNOSIS — Z363 Encounter for antenatal screening for malformations: Secondary | ICD-10-CM | POA: Diagnosis not present

## 2019-07-05 ENCOUNTER — Encounter (HOSPITAL_COMMUNITY): Payer: Self-pay | Admitting: Obstetrics and Gynecology

## 2019-07-05 ENCOUNTER — Other Ambulatory Visit: Payer: Self-pay

## 2019-07-05 ENCOUNTER — Inpatient Hospital Stay (HOSPITAL_COMMUNITY)
Admission: AD | Admit: 2019-07-05 | Discharge: 2019-07-05 | Disposition: A | Discharge status: Home / Self Care | Payer: BC Managed Care – PPO | Attending: Obstetrics and Gynecology | Admitting: Obstetrics and Gynecology

## 2019-07-05 DIAGNOSIS — O26892 Other specified pregnancy related conditions, second trimester: Secondary | ICD-10-CM | POA: Diagnosis not present

## 2019-07-05 DIAGNOSIS — F329 Major depressive disorder, single episode, unspecified: Secondary | ICD-10-CM | POA: Insufficient documentation

## 2019-07-05 DIAGNOSIS — R319 Hematuria, unspecified: Secondary | ICD-10-CM

## 2019-07-05 DIAGNOSIS — O99342 Other mental disorders complicating pregnancy, second trimester: Secondary | ICD-10-CM | POA: Diagnosis not present

## 2019-07-05 DIAGNOSIS — O99612 Diseases of the digestive system complicating pregnancy, second trimester: Secondary | ICD-10-CM | POA: Diagnosis not present

## 2019-07-05 DIAGNOSIS — K589 Irritable bowel syndrome without diarrhea: Secondary | ICD-10-CM | POA: Diagnosis not present

## 2019-07-05 DIAGNOSIS — F419 Anxiety disorder, unspecified: Secondary | ICD-10-CM | POA: Diagnosis not present

## 2019-07-05 DIAGNOSIS — R311 Benign essential microscopic hematuria: Secondary | ICD-10-CM

## 2019-07-05 DIAGNOSIS — R109 Unspecified abdominal pain: Secondary | ICD-10-CM | POA: Diagnosis not present

## 2019-07-05 DIAGNOSIS — O99891 Other specified diseases and conditions complicating pregnancy: Secondary | ICD-10-CM | POA: Diagnosis not present

## 2019-07-05 DIAGNOSIS — R55 Syncope and collapse: Secondary | ICD-10-CM

## 2019-07-05 DIAGNOSIS — O09292 Supervision of pregnancy with other poor reproductive or obstetric history, second trimester: Secondary | ICD-10-CM | POA: Diagnosis not present

## 2019-07-05 DIAGNOSIS — Z79899 Other long term (current) drug therapy: Secondary | ICD-10-CM | POA: Insufficient documentation

## 2019-07-05 DIAGNOSIS — Z3A21 21 weeks gestation of pregnancy: Secondary | ICD-10-CM | POA: Diagnosis not present

## 2019-07-05 DIAGNOSIS — O2652 Maternal hypotension syndrome, second trimester: Secondary | ICD-10-CM | POA: Diagnosis not present

## 2019-07-05 DIAGNOSIS — Z8759 Personal history of other complications of pregnancy, childbirth and the puerperium: Secondary | ICD-10-CM | POA: Insufficient documentation

## 2019-07-05 LAB — COMPREHENSIVE METABOLIC PANEL
ALT: 11 U/L (ref 0–44)
AST: 15 U/L (ref 15–41)
Albumin: 2.8 g/dL — ABNORMAL LOW (ref 3.5–5.0)
Alkaline Phosphatase: 54 U/L (ref 38–126)
Anion gap: 8 (ref 5–15)
BUN: 7 mg/dL (ref 6–20)
CO2: 23 mmol/L (ref 22–32)
Calcium: 8.9 mg/dL (ref 8.9–10.3)
Chloride: 105 mmol/L (ref 98–111)
Creatinine, Ser: 0.46 mg/dL (ref 0.44–1.00)
GFR calc Af Amer: >60 mL/min (ref >60)
GFR calc non Af Amer: >60 mL/min (ref >60)
Glucose, Bld: 93 mg/dL (ref 70–99)
Potassium: 3.8 mmol/L (ref 3.5–5.1)
Sodium: 136 mmol/L (ref 135–145)
Total Bilirubin: 0.4 mg/dL (ref 0.3–1.2)
Total Protein: 5.8 g/dL — ABNORMAL LOW (ref 6.5–8.1)

## 2019-07-05 LAB — CBC
HCT: 32.1 % — ABNORMAL LOW (ref 36.0–46.0)
Hemoglobin: 11.1 g/dL — ABNORMAL LOW (ref 12.0–15.0)
MCH: 30.2 pg (ref 26.0–34.0)
MCHC: 34.6 g/dL (ref 30.0–36.0)
MCV: 87.5 fL (ref 80.0–100.0)
Platelets: 198 10*3/uL (ref 150–400)
RBC: 3.67 MIL/uL — ABNORMAL LOW (ref 3.87–5.11)
RDW: 12.4 % (ref 11.5–15.5)
WBC: 6.6 10*3/uL (ref 4.0–10.5)
nRBC: 0 % (ref 0.0–0.2)

## 2019-07-05 LAB — URINALYSIS, ROUTINE W REFLEX MICROSCOPIC
Bacteria, UA: NONE SEEN
Bilirubin Urine: NEGATIVE
Glucose, UA: NEGATIVE mg/dL
Ketones, ur: NEGATIVE mg/dL
Leukocytes,Ua: NEGATIVE
Nitrite: NEGATIVE
Protein, ur: 100 mg/dL — AB
RBC / HPF: >50 RBC/hpf — ABNORMAL HIGH (ref 0–5)
Specific Gravity, Urine: 1.018 (ref 1.005–1.030)
pH: 7 (ref 5.0–8.0)

## 2019-07-05 NOTE — MAU Note (Signed)
Melanie Oneill is a 29 y.o. at [redacted]w[redacted]d here in MAU reporting: woke up this AM with right lower abdominal pain. Felt lightheaded and then in the car while sitting down she lost her vision. States she is not feeling lightheaded now. Denies bleeding. No LOF.   Onset of complaint: today  Pain score: 4/10  Vitals:   07/05/19 1012  BP: (!) 99/58  Pulse: 82  Resp: 16  Temp: 98.4 F (36.9 C)  SpO2: 100%     FHT:151  Lab orders placed from triage: UA

## 2019-07-05 NOTE — MAU Provider Note (Signed)
Chief Complaint: Abdominal Pain   First Provider Initiated Contact with Patient 07/05/19 1046      SUBJECTIVE HPI: Melanie Oneill is a 29 y.o. G3P1011 at [redacted]w[redacted]d by LMP who presents to maternity admissions reporting an episode of near syncope in the car this morning. She reports she ate breakfast, oatmeal, fruit, a waffle, and peanut butter, and had water to drink before the episode.  She has hx of similar symptoms outside of and during pregnancy but not as bad as it was today.  She did not pass out but her vision faded to black and she was unable to speak coherently according to her husband for 2-3 minutes.  She did answer questions appropriately during the episode per her husband but had difficulty doing so.  She also reports abdominal cramping this morning. Last intercourse was last night.    Abdominal pain: Location: lower abdomen Quality: cramping Severity: 5/10 on pain scale Duration: 4-5 hours Timing: intermittent Modifying factors: none Associated signs and symptoms: none  HPI  Past Medical History:  Diagnosis Date  . Abnormal Pap smear of cervix 07/21/2015   ASCUS cant rule out high grade  . Allergy   . Anxiety   . Depression   . Headache   . IBS (irritable bowel syndrome)   . Inverse psoriasis    Past Surgical History:  Procedure Laterality Date  . CHROMOPERTUBATION N/A 10/25/2016   Procedure: CHROMOPERTUBATION;  Surgeon: Patton Salles, MD;  Location: [facility];  Service: Gynecology;  Laterality: N/A;  . COLPOSCOPY  08/2015   LGSIL  . DIAGNOSTIC LAPAROSCOPY WITH REMOVAL OF ECTOPIC PREGNANCY N/A 10/25/2016   Procedure: DIAGNOSTIC LAPAROSCOPY WITH REMOVAL OF ECTOPIC PREGNANCY;  Surgeon: Patton Salles, MD;  Location: [facility];  Service: Gynecology;  Laterality: N/A;  . DILATION AND EVACUATION N/A 10/25/2016   Procedure: DILATATION AND EVACUATION;  Surgeon: Patton Salles, MD;  Location: [facility];  Service: Gynecology;  Laterality: N/A;   . nexplanon insertion     10-22-13, removed 05-17-16  . TONSILLECTOMY    . UNILATERAL SALPINGECTOMY Right 10/25/2016   Procedure: UNILATERAL SALPINGECTOMY;  Surgeon: Patton Salles, MD;  Location: [facility];  Service: Gynecology;  Laterality: Right;  . WISDOM TOOTH EXTRACTION     Social History   Socioeconomic History  . Marital status: Married    Spouse name: Not on file  . Number of children: Not on file  . Years of education: Not on file  . Highest education level: Not on file  Occupational History  . Not on file  Tobacco Use  . Smoking status: Never Smoker  . Smokeless tobacco: Never Used  Vaping Use  . Vaping Use: Never used  Substance and Sexual Activity  . Alcohol use: No  . Drug use: No  . Sexual activity: Yes    Partners: Male    Birth control/protection: None  Other Topics Concern  . Not on file  Social History Narrative  . Not on file   Social Determinants of Health   Financial Resource Strain:   . Difficulty of Paying Living Expenses:   Food Insecurity:   . Worried About Programme researcher, broadcasting/film/video in the Last Year:   . Barista in the Last Year:   Transportation Needs:   . Freight forwarder (Medical):   Marland Kitchen Lack of Transportation (Non-Medical):   Physical Activity:   . Days of Exercise per Week:   . Minutes  of Exercise per Session:   Stress:   . Feeling of Stress :   Social Connections:   . Frequency of Communication with Friends and Family:   . Frequency of Social Gatherings with Friends and Family:   . Attends Religious Services:   . Active Member of Clubs or Organizations:   . Attends Banker Meetings:   Marland Kitchen Marital Status:   Intimate Partner Violence:   . Fear of Current or Ex-Partner:   . Emotionally Abused:   Marland Kitchen Physically Abused:   . Sexually Abused:    No current facility-administered medications on file prior to encounter.   Current Outpatient Medications on File Prior to Encounter  Medication Sig Dispense  Refill  . Calcium Carbonate Antacid (ALKA-SELTZER ANTACID PO) Take 1 tablet by mouth 2 (two) times daily as needed (heartburn).    . fexofenadine (ALLEGRA) 180 MG tablet Take 180 mg by mouth every evening.    Marland Kitchen FLUoxetine (PROZAC) 40 MG capsule Take 40 mg by mouth at bedtime.     . hydrocortisone cream 1 % Apply 1 application topically daily as needed for itching (psoriasis).    . Prenatal Vit-Fe Fumarate-FA (PRENATAL MULTIVITAMIN) TABS tablet Take 1 tablet by mouth at bedtime.     Allergies  Allergen Reactions  . Wheat Bran Anaphylaxis    ROS:  Review of Systems   I have reviewed patient's Past Medical Hx, Surgical Hx, Family Hx, Social Hx, medications and allergies.   Physical Exam   Patient Vitals for the past 24 hrs:  BP Temp Temp src Pulse Resp SpO2 Height Weight  07/05/19 1223 -- -- -- -- 16 -- -- --  07/05/19 1222 (!) 108/55 -- -- 81 -- -- -- --  07/05/19 1012 (!) 99/58 98.4 F (36.9 C) Oral 82 16 100 % -- --  07/05/19 1007 -- -- -- -- -- -- 5\' 8"  (1.727 m) 79.1 kg   Constitutional: Well-developed, well-nourished female in no acute distress.  Cardiovascular: normal rate Respiratory: normal effort GI: Abd soft, non-tender. Pos BS x 4 MS: Extremities nontender, no edema, normal ROM Neurological - alert, oriented, normal speech, no focal findings or movement disorder noted, screening mental status exam normal, cranial nerves II through XII intact, DTR's normal and symmetric, motor and sensory grossly normal bilaterally, normal muscle tone, no tremors, strength 5/5 GU: Neg CVAT.  PELVIC EXAM: Deferred  FHT 151 by doppler  LAB RESULTS Results for orders placed or performed during the hospital encounter of 07/05/19 (from the past 24 hour(s))  CBC     Status: Abnormal   Collection Time: 07/05/19 11:09 AM  Result Value Ref Range   WBC 6.6 4.0 - 10.5 K/uL   RBC 3.67 (L) 3.87 - 5.11 MIL/uL   Hemoglobin 11.1 (L) 12.0 - 15.0 g/dL   HCT 09/05/19 (L) 36 - 46 %   MCV 87.5 80.0 -  100.0 fL   MCH 30.2 26.0 - 34.0 pg   MCHC 34.6 30.0 - 36.0 g/dL   RDW 16.1 09.6 - 04.5 %   Platelets 198 150 - 400 K/uL   nRBC 0.0 0.0 - 0.2 %  Comprehensive metabolic panel     Status: Abnormal   Collection Time: 07/05/19 11:09 AM  Result Value Ref Range   Sodium 136 135 - 145 mmol/L   Potassium 3.8 3.5 - 5.1 mmol/L   Chloride 105 98 - 111 mmol/L   CO2 23 22 - 32 mmol/L   Glucose, Bld 93 70 - 99 mg/dL  BUN 7 6 - 20 mg/dL   Creatinine, Ser 3.53 0.44 - 1.00 mg/dL   Calcium 8.9 8.9 - 29.9 mg/dL   Total Protein 5.8 (L) 6.5 - 8.1 g/dL   Albumin 2.8 (L) 3.5 - 5.0 g/dL   AST 15 15 - 41 U/L   ALT 11 0 - 44 U/L   Alkaline Phosphatase 54 38 - 126 U/L   Total Bilirubin 0.4 0.3 - 1.2 mg/dL   GFR calc non Af Amer >60 >60 mL/min   GFR calc Af Amer >60 >60 mL/min   Anion gap 8 5 - 15  Urinalysis, Routine w reflex microscopic     Status: Abnormal   Collection Time: 07/05/19 11:23 AM  Result Value Ref Range   Color, Urine YELLOW YELLOW   APPearance CLOUDY (A) CLEAR   Specific Gravity, Urine 1.018 1.005 - 1.030   pH 7.0 5.0 - 8.0   Glucose, UA NEGATIVE NEGATIVE mg/dL   Hgb urine dipstick MODERATE (A) NEGATIVE   Bilirubin Urine NEGATIVE NEGATIVE   Ketones, ur NEGATIVE NEGATIVE mg/dL   Protein, ur 242 (A) NEGATIVE mg/dL   Nitrite NEGATIVE NEGATIVE   Leukocytes,Ua NEGATIVE NEGATIVE   RBC / HPF >50 (H) 0 - 5 RBC/hpf   WBC, UA 6-10 0 - 5 WBC/hpf   Bacteria, UA NONE SEEN NONE SEEN   Squamous Epithelial / LPF 0-5 0 - 5   EKG: normal EKG, normal sinus rhythm.     IMAGING No results found.  MAU Management/MDM: Orders Placed This Encounter  Procedures  . Urinalysis, Routine w reflex microscopic  . CBC  . Comprehensive metabolic panel  . ED EKG  . Discharge patient    No orders of the defined types were placed in this encounter.   Given normal neuro exam, normal EKG, and pt improved symptoms in MAU, likely hypotensive episode that resolved. Pt also has hx of similar symptoms even  outside of pregnancy.  CBC, CMP wnl.  There is hematuria but pt had intercourse last night which is likely cause.  Cervix is closed/thick/high so no evidence of preterm labor. FHT are wnl.    Pt discharged home, encourage to eat and drink regularly and f/u in the office.  Return to MAU for worsening symptoms. .  ASSESSMENT 1. Near syncope   2. Benign essential microscopic hematuria   3. Abdominal pain during pregnancy, second trimester   4. Maternal hypotension syndrome in second trimester     PLAN Discharge home Allergies as of 07/05/2019      Reactions   Wheat Bran Anaphylaxis      Medication List    TAKE these medications   ALKA-SELTZER ANTACID PO Take 1 tablet by mouth 2 (two) times daily as needed (heartburn).   fexofenadine 180 MG tablet Commonly known as: ALLEGRA Take 180 mg by mouth every evening.   FLUoxetine 40 MG capsule Commonly known as: PROZAC Take 40 mg by mouth at bedtime.   hydrocortisone cream 1 % Apply 1 application topically daily as needed for itching (psoriasis).   prenatal multivitamin Tabs tablet Take 1 tablet by mouth at bedtime.       Follow-up Information    [place] Follow up.   Why: As scheduled, return to MAU as needed for emergencies Contact information: [address] [place] [address] [place] [address] [phone]               Sharen Counter Certified Nurse-Midwife 07/05/2019  12:25 PM

## 2019-07-10 ENCOUNTER — Inpatient Hospital Stay (HOSPITAL_COMMUNITY)
Admission: AD | Admit: 2019-07-10 | Discharge: 2019-07-10 | Disposition: A | Discharge status: Home / Self Care | Payer: BC Managed Care – PPO | Attending: Obstetrics & Gynecology | Admitting: Obstetrics & Gynecology

## 2019-07-10 ENCOUNTER — Encounter (HOSPITAL_COMMUNITY): Payer: Self-pay | Admitting: Obstetrics & Gynecology

## 2019-07-10 ENCOUNTER — Other Ambulatory Visit: Payer: Self-pay

## 2019-07-10 DIAGNOSIS — Z8759 Personal history of other complications of pregnancy, childbirth and the puerperium: Secondary | ICD-10-CM | POA: Diagnosis not present

## 2019-07-10 DIAGNOSIS — K589 Irritable bowel syndrome without diarrhea: Secondary | ICD-10-CM | POA: Diagnosis not present

## 2019-07-10 DIAGNOSIS — O26892 Other specified pregnancy related conditions, second trimester: Secondary | ICD-10-CM | POA: Diagnosis not present

## 2019-07-10 DIAGNOSIS — R42 Dizziness and giddiness: Secondary | ICD-10-CM | POA: Diagnosis not present

## 2019-07-10 DIAGNOSIS — O99342 Other mental disorders complicating pregnancy, second trimester: Secondary | ICD-10-CM | POA: Diagnosis not present

## 2019-07-10 DIAGNOSIS — F419 Anxiety disorder, unspecified: Secondary | ICD-10-CM | POA: Insufficient documentation

## 2019-07-10 DIAGNOSIS — O09292 Supervision of pregnancy with other poor reproductive or obstetric history, second trimester: Secondary | ICD-10-CM | POA: Diagnosis not present

## 2019-07-10 DIAGNOSIS — Z3A22 22 weeks gestation of pregnancy: Secondary | ICD-10-CM | POA: Diagnosis not present

## 2019-07-10 DIAGNOSIS — O2652 Maternal hypotension syndrome, second trimester: Secondary | ICD-10-CM

## 2019-07-10 DIAGNOSIS — F329 Major depressive disorder, single episode, unspecified: Secondary | ICD-10-CM | POA: Insufficient documentation

## 2019-07-10 DIAGNOSIS — O99612 Diseases of the digestive system complicating pregnancy, second trimester: Secondary | ICD-10-CM | POA: Diagnosis not present

## 2019-07-10 DIAGNOSIS — Z79899 Other long term (current) drug therapy: Secondary | ICD-10-CM | POA: Insufficient documentation

## 2019-07-10 DIAGNOSIS — R55 Syncope and collapse: Secondary | ICD-10-CM

## 2019-07-10 LAB — URINALYSIS, ROUTINE W REFLEX MICROSCOPIC
Bilirubin Urine: NEGATIVE
Glucose, UA: 50 mg/dL — AB
Hgb urine dipstick: NEGATIVE
Ketones, ur: NEGATIVE mg/dL
Nitrite: NEGATIVE
Protein, ur: NEGATIVE mg/dL
Specific Gravity, Urine: 1.011 (ref 1.005–1.030)
pH: 6 (ref 5.0–8.0)

## 2019-07-10 LAB — COMPREHENSIVE METABOLIC PANEL
ALT: 11 U/L (ref 0–44)
AST: 14 U/L — ABNORMAL LOW (ref 15–41)
Albumin: 2.6 g/dL — ABNORMAL LOW (ref 3.5–5.0)
Alkaline Phosphatase: 56 U/L (ref 38–126)
Anion gap: 7 (ref 5–15)
BUN: 7 mg/dL (ref 6–20)
CO2: 25 mmol/L (ref 22–32)
Calcium: 8.6 mg/dL — ABNORMAL LOW (ref 8.9–10.3)
Chloride: 103 mmol/L (ref 98–111)
Creatinine, Ser: 0.55 mg/dL (ref 0.44–1.00)
GFR calc Af Amer: >60 mL/min (ref >60)
GFR calc non Af Amer: >60 mL/min (ref >60)
Glucose, Bld: 91 mg/dL (ref 70–99)
Potassium: 3.5 mmol/L (ref 3.5–5.1)
Sodium: 135 mmol/L (ref 135–145)
Total Bilirubin: 0.3 mg/dL (ref 0.3–1.2)
Total Protein: 5.6 g/dL — ABNORMAL LOW (ref 6.5–8.1)

## 2019-07-10 LAB — CBC
HCT: 32.9 % — ABNORMAL LOW (ref 36.0–46.0)
Hemoglobin: 11.4 g/dL — ABNORMAL LOW (ref 12.0–15.0)
MCH: 30.6 pg (ref 26.0–34.0)
MCHC: 34.7 g/dL (ref 30.0–36.0)
MCV: 88.2 fL (ref 80.0–100.0)
Platelets: 184 10*3/uL (ref 150–400)
RBC: 3.73 MIL/uL — ABNORMAL LOW (ref 3.87–5.11)
RDW: 12.5 % (ref 11.5–15.5)
WBC: 7.1 10*3/uL (ref 4.0–10.5)
nRBC: 0 % (ref 0.0–0.2)

## 2019-07-10 MED ORDER — LACTATED RINGERS IV BOLUS
1000.0000 mL | Freq: Once | INTRAVENOUS | Status: AC
  Administered 2019-07-10: 1000 mL via INTRAVENOUS

## 2019-07-10 NOTE — MAU Note (Signed)
Was here Sat, was losing vision.  Today, she got dizzy/like blacking out, losing vision, pulse was elevated. (104). Was standing up doing makeup, sat down, feeling somewhat passed, pulse remained elevated. Ate something, initially didn't help.  Has been having dizziness through out preg.

## 2019-07-10 NOTE — MAU Provider Note (Signed)
Chief Complaint: Dizziness   First Provider Initiated Contact with Patient 07/10/19 1523      SUBJECTIVE HPI: Melanie Oneill is a 29 y.o. G3P1011 at [redacted]w[redacted]d who presents to maternity admissions reporting an episode of near syncope with vision changes and dizziness while standing in her bathroom today.  She reports dizziness frequently, especially when standing for prolonged periods or standing up quickly.  She had a similar near syncopal episode on 07/05/19 and had MAU evaluation at that time. Today was similar to that episode so she came for further evaluation.  She is eating and drinking normally and denies any n/v. She denies vaginal bleeding, vaginal itching/burning, urinary symptoms, h/a, dizziness, n/v, or fever/chills.      HPI  Past Medical History:  Diagnosis Date  . Abnormal Pap smear of cervix 07/21/2015   ASCUS cant rule out high grade  . Allergy   . Anxiety   . Depression   . Headache   . IBS (irritable bowel syndrome)   . Inverse psoriasis    Past Surgical History:  Procedure Laterality Date  . CHROMOPERTUBATION N/A 10/25/2016   Procedure: CHROMOPERTUBATION;  Surgeon: Patton Salles, MD;  Location: [facility];  Service: Gynecology;  Laterality: N/A;  . COLPOSCOPY  08/2015   LGSIL  . DIAGNOSTIC LAPAROSCOPY WITH REMOVAL OF ECTOPIC PREGNANCY N/A 10/25/2016   Procedure: DIAGNOSTIC LAPAROSCOPY WITH REMOVAL OF ECTOPIC PREGNANCY;  Surgeon: Patton Salles, MD;  Location: [facility];  Service: Gynecology;  Laterality: N/A;  . DILATION AND EVACUATION N/A 10/25/2016   Procedure: DILATATION AND EVACUATION;  Surgeon: Patton Salles, MD;  Location: [facility];  Service: Gynecology;  Laterality: N/A;  . nexplanon insertion     10-22-13, removed 05-17-16  . TONSILLECTOMY    . UNILATERAL SALPINGECTOMY Right 10/25/2016   Procedure: UNILATERAL SALPINGECTOMY;  Surgeon: Patton Salles, MD;  Location: [facility];  Service: Gynecology;  Laterality: Right;   . WISDOM TOOTH EXTRACTION     Social History   Socioeconomic History  . Marital status: Married    Spouse name: Not on file  . Number of children: Not on file  . Years of education: Not on file  . Highest education level: Not on file  Occupational History  . Not on file  Tobacco Use  . Smoking status: Never Smoker  . Smokeless tobacco: Never Used  Vaping Use  . Vaping Use: Never used  Substance and Sexual Activity  . Alcohol use: No  . Drug use: No  . Sexual activity: Yes    Partners: Male    Birth control/protection: None  Other Topics Concern  . Not on file  Social History Narrative  . Not on file   Social Determinants of Health   Financial Resource Strain:   . Difficulty of Paying Living Expenses:   Food Insecurity:   . Worried About Programme researcher, broadcasting/film/video in the Last Year:   . Barista in the Last Year:   Transportation Needs:   . Freight forwarder (Medical):   Marland Kitchen Lack of Transportation (Non-Medical):   Physical Activity:   . Days of Exercise per Week:   . Minutes of Exercise per Session:   Stress:   . Feeling of Stress :   Social Connections:   . Frequency of Communication with Friends and Family:   . Frequency of Social Gatherings with Friends and Family:   . Attends Religious Services:   . Active  Member of Clubs or Organizations:   . Attends Banker Meetings:   Marland Kitchen Marital Status:   Intimate Partner Violence:   . Fear of Current or Ex-Partner:   . Emotionally Abused:   Marland Kitchen Physically Abused:   . Sexually Abused:    No current facility-administered medications on file prior to encounter.   Current Outpatient Medications on File Prior to Encounter  Medication Sig Dispense Refill  . fexofenadine (ALLEGRA) 180 MG tablet Take 180 mg by mouth every evening.    Marland Kitchen FLUoxetine (PROZAC) 40 MG capsule Take 40 mg by mouth at bedtime.     . Prenatal Vit-Fe Fumarate-FA (PRENATAL MULTIVITAMIN) TABS tablet Take 1 tablet by mouth at bedtime.    .  Calcium Carbonate Antacid (ALKA-SELTZER ANTACID PO) Take 1 tablet by mouth 2 (two) times daily as needed (heartburn).    . hydrocortisone cream 1 % Apply 1 application topically daily as needed for itching (psoriasis).     Allergies  Allergen Reactions  . Wheat Bran Anaphylaxis    ROS:  Review of Systems  Constitutional: Negative for chills, fatigue and fever.  Eyes: Positive for visual disturbance.  Respiratory: Negative for shortness of breath.   Cardiovascular: Negative for chest pain.  Gastrointestinal: Negative for abdominal pain, nausea and vomiting.  Genitourinary: Negative for difficulty urinating, dysuria, flank pain, pelvic pain, vaginal bleeding, vaginal discharge and vaginal pain.  Neurological: Positive for dizziness and light-headedness. Negative for headaches.  Psychiatric/Behavioral: Negative.      I have reviewed patient's Past Medical Hx, Surgical Hx, Family Hx, Social Hx, medications and allergies.   Physical Exam   Patient Vitals for the past 24 hrs:  BP Temp Temp src Pulse Resp SpO2 Height Weight  07/10/19 1741 105/60 -- -- 84 16 -- -- --  07/10/19 1336 125/62 98.3 F (36.8 C) Oral 85 20 100 % 5\' 8"  (1.727 m) 80.3 kg   Constitutional: Well-developed, well-nourished female in no acute distress.  HEART: normal rate, heart sounds, regular rhythm RESP: normal effort, lung sounds clear and equal bilaterally GI: Abd soft, non-tender. Pos BS x 4 MS: Extremities nontender, no edema, normal ROM Neurological - alert, oriented, normal speech, no focal findings or movement disorder noted, screening mental status exam normal, cranial nerves II through XII intact, DTR's normal and symmetric, motor and sensory grossly normal bilaterally, normal muscle tone, no tremors, strength 5/5  GU: Neg CVAT.  PELVIC EXAM: Deferred  FHT 141 by doppler  EKG: normal EKG, normal sinus rhythm.  LAB RESULTS Results for orders placed or performed during the hospital encounter of  07/10/19 (from the past 24 hour(s))  Urinalysis, Routine w reflex microscopic     Status: Abnormal   Collection Time: 07/10/19  1:43 PM  Result Value Ref Range   Color, Urine YELLOW YELLOW   APPearance CLEAR CLEAR   Specific Gravity, Urine 1.011 1.005 - 1.030   pH 6.0 5.0 - 8.0   Glucose, UA 50 (A) NEGATIVE mg/dL   Hgb urine dipstick NEGATIVE NEGATIVE   Bilirubin Urine NEGATIVE NEGATIVE   Ketones, ur NEGATIVE NEGATIVE mg/dL   Protein, ur NEGATIVE NEGATIVE mg/dL   Nitrite NEGATIVE NEGATIVE   Leukocytes,Ua TRACE (A) NEGATIVE   RBC / HPF 0-5 0 - 5 RBC/hpf   WBC, UA 0-5 0 - 5 WBC/hpf   Bacteria, UA RARE (A) NONE SEEN   Squamous Epithelial / LPF 6-10 0 - 5   Mucus PRESENT   CBC     Status: Abnormal  Collection Time: 07/10/19  2:54 PM  Result Value Ref Range   WBC 7.1 4.0 - 10.5 K/uL   RBC 3.73 (L) 3.87 - 5.11 MIL/uL   Hemoglobin 11.4 (L) 12.0 - 15.0 g/dL   HCT 07.6 (L) 36 - 46 %   MCV 88.2 80.0 - 100.0 fL   MCH 30.6 26.0 - 34.0 pg   MCHC 34.7 30.0 - 36.0 g/dL   RDW 22.6 33.3 - 54.5 %   Platelets 184 150 - 400 K/uL   nRBC 0.0 0.0 - 0.2 %  Comprehensive metabolic panel     Status: Abnormal   Collection Time: 07/10/19  2:54 PM  Result Value Ref Range   Sodium 135 135 - 145 mmol/L   Potassium 3.5 3.5 - 5.1 mmol/L   Chloride 103 98 - 111 mmol/L   CO2 25 22 - 32 mmol/L   Glucose, Bld 91 70 - 99 mg/dL   BUN 7 6 - 20 mg/dL   Creatinine, Ser 6.25 0.44 - 1.00 mg/dL   Calcium 8.6 (L) 8.9 - 10.3 mg/dL   Total Protein 5.6 (L) 6.5 - 8.1 g/dL   Albumin 2.6 (L) 3.5 - 5.0 g/dL   AST 14 (L) 15 - 41 U/L   ALT 11 0 - 44 U/L   Alkaline Phosphatase 56 38 - 126 U/L   Total Bilirubin 0.3 0.3 - 1.2 mg/dL   GFR calc non Af Amer >60 >60 mL/min   GFR calc Af Amer >60 >60 mL/min   Anion gap 7 5 - 15       IMAGING No results found.  MAU Management/MDM: Orders Placed This Encounter  Procedures  . Urinalysis, Routine w reflex microscopic  . CBC  . Comprehensive metabolic panel  . ED EKG   . Discharge patient    Meds ordered this encounter  Medications  . lactated ringers bolus 1,000 mL    Normal EKG and heart/lung sounds, normal neuro exam. Hgb stable at 11.4, with normal electrolytes.  Likely maternal hypotension of pregnancy but needs further evaluation.  Consult Dr Macon Large with assessment and findings. Outpatient cardiology referral recommended.  D/C home, message left message with [facility] to refer pt to cardiology.  Pt to return to MAU with worsening symptoms. Remain hydrated, eat/drink regularly.Pt discharged with strict return precautions.  ASSESSMENT 1. Maternal hypotension syndrome in second trimester   2. Postural dizziness with near syncope     PLAN Discharge home Allergies as of 07/10/2019      Reactions   Wheat Bran Anaphylaxis      Medication List    TAKE these medications   ALKA-SELTZER ANTACID PO Take 1 tablet by mouth 2 (two) times daily as needed (heartburn).   fexofenadine 180 MG tablet Commonly known as: ALLEGRA Take 180 mg by mouth every evening.   FLUoxetine 40 MG capsule Commonly known as: PROZAC Take 40 mg by mouth at bedtime.   hydrocortisone cream 1 % Apply 1 application topically daily as needed for itching (psoriasis).   prenatal multivitamin Tabs tablet Take 1 tablet by mouth at bedtime.       Follow-up Information    [place] Follow up.   Why: As scheduled, with referral to cardiology outpatient.  Return to MAU as needed for emergencies. Contact information: [address] [place] [address] [phone]        [facility] [address] Office Follow up.   Specialty: Cardiology Why: Your OB office will  refer you to cardiology. Contact information: [address], [address] [place] [id number] [phone]              Sharen CounterLisa Leftwich-Kirby Certified Nurse-Midwife 07/10/2019  9:07 PM

## 2019-07-14 ENCOUNTER — Telehealth: Payer: Self-pay

## 2019-07-14 NOTE — Telephone Encounter (Signed)
NOTES ON FILE FROM MEGAN MANSELL MORRIS [phone] SENT REFERRAL TO SCHEDULING 

## 2019-07-21 DIAGNOSIS — Z3A23 23 weeks gestation of pregnancy: Secondary | ICD-10-CM | POA: Diagnosis not present

## 2019-07-21 DIAGNOSIS — Z362 Encounter for other antenatal screening follow-up: Secondary | ICD-10-CM | POA: Diagnosis not present

## 2019-08-21 DIAGNOSIS — Z23 Encounter for immunization: Secondary | ICD-10-CM | POA: Diagnosis not present

## 2019-08-21 DIAGNOSIS — Z3480 Encounter for supervision of other normal pregnancy, unspecified trimester: Secondary | ICD-10-CM | POA: Diagnosis not present

## 2019-08-29 ENCOUNTER — Ambulatory Visit: Payer: BC Managed Care – PPO | Admitting: Interventional Cardiology

## 2019-09-26 ENCOUNTER — Inpatient Hospital Stay (HOSPITAL_COMMUNITY)
Admission: AD | Admit: 2019-09-26 | Discharge: 2019-09-26 | Disposition: A | Discharge status: Home / Self Care | Payer: BC Managed Care – PPO | Attending: Obstetrics and Gynecology | Admitting: Obstetrics and Gynecology

## 2019-09-26 ENCOUNTER — Encounter (HOSPITAL_COMMUNITY): Payer: Self-pay | Admitting: Obstetrics and Gynecology

## 2019-09-26 ENCOUNTER — Other Ambulatory Visit: Payer: Self-pay

## 2019-09-26 DIAGNOSIS — R109 Unspecified abdominal pain: Secondary | ICD-10-CM | POA: Diagnosis not present

## 2019-09-26 DIAGNOSIS — O99343 Other mental disorders complicating pregnancy, third trimester: Secondary | ICD-10-CM | POA: Diagnosis not present

## 2019-09-26 DIAGNOSIS — K589 Irritable bowel syndrome without diarrhea: Secondary | ICD-10-CM | POA: Insufficient documentation

## 2019-09-26 DIAGNOSIS — F329 Major depressive disorder, single episode, unspecified: Secondary | ICD-10-CM | POA: Diagnosis not present

## 2019-09-26 DIAGNOSIS — O4703 False labor before 37 completed weeks of gestation, third trimester: Secondary | ICD-10-CM | POA: Insufficient documentation

## 2019-09-26 DIAGNOSIS — O26893 Other specified pregnancy related conditions, third trimester: Secondary | ICD-10-CM | POA: Insufficient documentation

## 2019-09-26 DIAGNOSIS — O99613 Diseases of the digestive system complicating pregnancy, third trimester: Secondary | ICD-10-CM | POA: Insufficient documentation

## 2019-09-26 DIAGNOSIS — Z79899 Other long term (current) drug therapy: Secondary | ICD-10-CM | POA: Insufficient documentation

## 2019-09-26 DIAGNOSIS — F419 Anxiety disorder, unspecified: Secondary | ICD-10-CM | POA: Insufficient documentation

## 2019-09-26 DIAGNOSIS — Z3A33 33 weeks gestation of pregnancy: Secondary | ICD-10-CM | POA: Diagnosis not present

## 2019-09-26 DIAGNOSIS — O479 False labor, unspecified: Secondary | ICD-10-CM

## 2019-09-26 LAB — URINALYSIS, ROUTINE W REFLEX MICROSCOPIC
Bilirubin Urine: NEGATIVE
Glucose, UA: NEGATIVE mg/dL
Hgb urine dipstick: NEGATIVE
Ketones, ur: NEGATIVE mg/dL
Nitrite: NEGATIVE
Protein, ur: NEGATIVE mg/dL
Specific Gravity, Urine: 1.01 (ref 1.005–1.030)
pH: 7 (ref 5.0–8.0)

## 2019-09-26 LAB — FETAL FIBRONECTIN: Fetal Fibronectin: NEGATIVE

## 2019-09-26 MED ORDER — NIFEDIPINE 10 MG PO CAPS
10.0000 mg | ORAL_CAPSULE | Freq: Once | ORAL | Status: AC
  Administered 2019-09-26: 10 mg via ORAL
  Filled 2019-09-26: qty 1

## 2019-09-26 NOTE — MAU Provider Note (Signed)
History     CSN: [id number]  Arrival date and time: 09/26/19 1322   First Provider Initiated Contact with Patient 09/26/19 1427      No chief complaint on file.  HPI   Melanie Oneill is a 29 y.o. female G3P1011 @ [redacted]w[redacted]d here with contraction/cramping like pain. She reports that it is not always painful; feels more uncomfortable.  This has been going on for 3 days; the pain comes and goes and is accompanied by pressure in her anus. She reports loser stools X 3 days. No bleeding. + fetal movement.  She has no history of preterm labor or preterm delivery. Declines urinary complaints.    OB History    Gravida  3   Para  1   Term  1   Preterm      AB  1   Living  1     SAB      TAB      Ectopic  1   Multiple  0   Live Births  1           Past Medical History:  Diagnosis Date  . Abnormal Pap smear of cervix 07/21/2015   ASCUS cant rule out high grade  . Allergy   . Anxiety   . Depression    doing ok  . Headache   . IBS (irritable bowel syndrome)   . Inverse psoriasis     Past Surgical History:  Procedure Laterality Date  . CHROMOPERTUBATION N/A 10/25/2016   Procedure: CHROMOPERTUBATION;  Surgeon: Patton Salles, MD;  Location: [facility];  Service: Gynecology;  Laterality: N/A;  . COLPOSCOPY  08/2015   LGSIL  . DIAGNOSTIC LAPAROSCOPY WITH REMOVAL OF ECTOPIC PREGNANCY N/A 10/25/2016   Procedure: DIAGNOSTIC LAPAROSCOPY WITH REMOVAL OF ECTOPIC PREGNANCY;  Surgeon: Patton Salles, MD;  Location: [facility];  Service: Gynecology;  Laterality: N/A;  . DILATION AND EVACUATION N/A 10/25/2016   Procedure: DILATATION AND EVACUATION;  Surgeon: Patton Salles, MD;  Location: [facility];  Service: Gynecology;  Laterality: N/A;  . nexplanon insertion     10-22-13, removed 05-17-16  . TONSILLECTOMY    . UNILATERAL SALPINGECTOMY Right 10/25/2016   Procedure: UNILATERAL SALPINGECTOMY;  Surgeon: Patton Salles, MD;  Location:  [facility];  Service: Gynecology;  Laterality: Right;  . WISDOM TOOTH EXTRACTION      Family History  Problem Relation Age of Onset  . Cancer Father        skin  . Hypertension Father   . Cancer Maternal Grandmother        liver  . Hypertension Paternal Grandmother   . Healthy Mother   . Asthma Brother   . Crohn's disease Paternal Grandfather   . Hypertension Paternal Grandfather     Social History   Tobacco Use  . Smoking status: Never Smoker  . Smokeless tobacco: Never Used  Vaping Use  . Vaping Use: Never used  Substance Use Topics  . Alcohol use: Not Currently  . Drug use: No    Allergies:  Allergies  Allergen Reactions  . Wheat Bran Anaphylaxis    Medications Prior to Admission  Medication Sig Dispense Refill Last Dose  . aluminum hydroxide-magnesium carbonate (GAVISCON) 95-358 MG/15ML SUSP Take by mouth as needed.   09/25/2019 at Unknown time  . FLUoxetine (PROZAC) 40 MG capsule Take 40 mg by mouth at bedtime.    09/25/2019 at Unknown time  . hydrocortisone cream  1 % Apply 1 application topically daily as needed for itching (psoriasis).   09/25/2019 at Unknown time  . Prenatal Vit-Fe Fumarate-FA (PRENATAL MULTIVITAMIN) TABS tablet Take 1 tablet by mouth at bedtime.   09/25/2019 at Unknown time  . Calcium Carbonate Antacid (ALKA-SELTZER ANTACID PO) Take 1 tablet by mouth 2 (two) times daily as needed (heartburn).     . fexofenadine (ALLEGRA) 180 MG tablet Take 180 mg by mouth every evening.      Results for orders placed or performed during the hospital encounter of 09/26/19 (from the past 48 hour(s))  Fetal fibronectin     Status: None   Collection Time: 09/26/19  2:53 PM  Result Value Ref Range   Fetal Fibronectin NEGATIVE NEGATIVE    Comment: Performed at [facility], [address] [place], [place] [address]  Urinalysis, Routine w reflex microscopic     Status: Abnormal   Collection Time: 09/26/19  3:25 PM  Result Value Ref Range   Color, Urine YELLOW  YELLOW   APPearance CLOUDY (A) CLEAR   Specific Gravity, Urine 1.010 1.005 - 1.030   pH 7.0 5.0 - 8.0   Glucose, UA NEGATIVE NEGATIVE mg/dL   Hgb urine dipstick NEGATIVE NEGATIVE   Bilirubin Urine NEGATIVE NEGATIVE   Ketones, ur NEGATIVE NEGATIVE mg/dL   Protein, ur NEGATIVE NEGATIVE mg/dL   Nitrite NEGATIVE NEGATIVE   Leukocytes,Ua LARGE (A) NEGATIVE   RBC / HPF 0-5 0 - 5 RBC/hpf   WBC, UA 11-20 0 - 5 WBC/hpf   Bacteria, UA RARE (A) NONE SEEN   Squamous Epithelial / LPF 21-50 0 - 5    Comment: Performed at [facility], [address], [place], [place] [address]    Review of Systems  Constitutional: Negative for fever.  Gastrointestinal: Negative for nausea and vomiting.  Genitourinary: Positive for vaginal bleeding and vaginal discharge. Negative for dysuria, flank pain and urgency.   Physical Exam   Blood pressure 105/65, pulse 82, temperature 98.1 F (36.7 C), temperature source Oral, resp. rate 18, height 5\' 8"  (1.727 m), weight 87 kg, SpO2 99 %, unknown if currently breastfeeding.  Physical Exam Constitutional:      General: She is not in acute distress.    Appearance: Normal appearance. She is not ill-appearing, toxic-appearing or diaphoretic.  Pulmonary:     Effort: Pulmonary effort is normal.  Abdominal:     Palpations: Abdomen is soft.     Tenderness: There is no abdominal tenderness.  Genitourinary:    Comments: Dilation: 1 Effacement (%): Thick Cervical Position: Posterior Exam by:: J.Lavone Weisel,NP FFN collected  Neurological:     Mental Status: She is alert.    Fetal Tracing: Baseline: 125 bpm Variability: Moderate  Accelerations: 15x15 Decelerations: None Toco: Irregular pattern initially.   MAU Course  Procedures  None  MDM  UA with urine culture FFN collected and negative Procardia X 1 dose.  Contraction pattern resolved.  Patient feels ok about going home  Assessment and Plan   A:  1. Braxton Hicks contractions   2. [redacted] weeks  gestation of pregnancy   3. Abdominal pain during pregnancy in third trimester     P:  Discharge home in stable condition Strict return precautions Close f/u next week, message left with the office to call patient Monday morning. She is scheduled for an OB visit on 9/29. Rest Increase oral fluid intake Kick counts reviewed.   10/29 I, NP 09/26/2019 7:02 PM

## 2019-09-26 NOTE — Discharge Instructions (Signed)
Braxton Hicks Contractions °Contractions of the uterus can occur throughout pregnancy, but they are not always a sign that you are in labor. You may have practice contractions called Braxton Hicks contractions. These false labor contractions are sometimes confused with true labor. °What are Braxton Hicks contractions? °Braxton Hicks contractions are tightening movements that occur in the muscles of the uterus before labor. Unlike true labor contractions, these contractions do not result in opening (dilation) and thinning of the cervix. Toward the end of pregnancy (32-34 weeks), Braxton Hicks contractions can happen more often and may become stronger. These contractions are sometimes difficult to tell apart from true labor because they can be very uncomfortable. You should not feel embarrassed if you go to the hospital with false labor. °Sometimes, the only way to tell if you are in true labor is for your health care provider to look for changes in the cervix. The health care provider will do a physical exam and may monitor your contractions. If you are not in true labor, the exam should show that your cervix is not dilating and your water has not broken. °If there are no other health problems associated with your pregnancy, it is completely safe for you to be sent home with false labor. You may continue to have Braxton Hicks contractions until you go into true labor. °How to tell the difference between true labor and false labor °True labor °· Contractions last 30-70 seconds. °· Contractions become very regular. °· Discomfort is usually felt in the top of the uterus, and it spreads to the lower abdomen and low back. °· Contractions do not go away with walking. °· Contractions usually become more intense and increase in frequency. °· The cervix dilates and gets thinner. °False labor °· Contractions are usually shorter and not as strong as true labor contractions. °· Contractions are usually irregular. °· Contractions  are often felt in the front of the lower abdomen and in the groin. °· Contractions may go away when you walk around or change positions while lying down. °· Contractions get weaker and are shorter-lasting as time goes on. °· The cervix usually does not dilate or become thin. °Follow these instructions at home: ° °· Take over-the-counter and prescription medicines only as told by your health care provider. °· Keep up with your usual exercises and follow other instructions from your health care provider. °· Eat and drink lightly if you think you are going into labor. °· If Braxton Hicks contractions are making you uncomfortable: °? Change your position from lying down or resting to walking, or change from walking to resting. °? Sit and rest in a tub of warm water. °? Drink enough fluid to keep your urine pale yellow. Dehydration may cause these contractions. °? Do slow and deep breathing several times an hour. °· Keep all follow-up prenatal visits as told by your health care provider. This is important. °Contact a health care provider if: °· You have a fever. °· You have continuous pain in your abdomen. °Get help right away if: °· Your contractions become stronger, more regular, and closer together. °· You have fluid leaking or gushing from your vagina. °· You pass blood-tinged mucus (bloody show). °· You have bleeding from your vagina. °· You have low back pain that you never had before. °· You feel your baby’s head pushing down and causing pelvic pressure. °· Your baby is not moving inside you as much as it used to. °Summary °· Contractions that occur before labor are   called Braxton Hicks contractions, false labor, or practice contractions. °· Braxton Hicks contractions are usually shorter, weaker, farther apart, and less regular than true labor contractions. True labor contractions usually become progressively stronger and regular, and they become more frequent. °· Manage discomfort from Braxton Hicks contractions  by changing position, resting in a warm bath, drinking plenty of water, or practicing deep breathing. °This information is not intended to replace advice given to you by your health care provider. Make sure you discuss any questions you have with your health care provider. °Document Revised: 12/01/2016 Document Reviewed: 05/04/2016 °Elsevier Patient Education © 2020 [employer] ° °

## 2019-09-26 NOTE — MAU Note (Signed)
Is having early labor contractions, started around 0930, feels like she needs to poop, cramping in lower abd and lower back.  Baby is moving, but less then usual. Denies hx of PTL or PTD.

## 2019-09-27 LAB — CULTURE, OB URINE

## 2019-09-29 ENCOUNTER — Telehealth: Payer: Self-pay | Admitting: Certified Nurse Midwife

## 2019-09-29 DIAGNOSIS — R8271 Bacteriuria: Secondary | ICD-10-CM

## 2019-09-29 MED ORDER — AMOXICILLIN 500 MG PO CAPS: 500.0000 mg | ORAL_CAPSULE | Freq: Three times a day (TID) | ORAL | 0 refills | Status: DC

## 2019-09-29 NOTE — Telephone Encounter (Signed)
Attempted to call patient to inform of GBS found in urine. No answer, pt's mailbox full. Script sent to pharmacy.

## 2019-10-01 DIAGNOSIS — R102 Pelvic and perineal pain: Secondary | ICD-10-CM | POA: Diagnosis not present

## 2019-10-01 DIAGNOSIS — N760 Acute vaginitis: Secondary | ICD-10-CM | POA: Diagnosis not present

## 2019-10-01 DIAGNOSIS — D649 Anemia, unspecified: Secondary | ICD-10-CM | POA: Diagnosis not present

## 2019-10-29 ENCOUNTER — Inpatient Hospital Stay (HOSPITAL_COMMUNITY): Payer: BC Managed Care – PPO | Admitting: Anesthesiology

## 2019-10-29 ENCOUNTER — Encounter (HOSPITAL_COMMUNITY): Payer: Self-pay | Admitting: Obstetrics and Gynecology

## 2019-10-29 ENCOUNTER — Other Ambulatory Visit: Payer: Self-pay

## 2019-10-29 ENCOUNTER — Inpatient Hospital Stay (HOSPITAL_COMMUNITY)
Admission: AD | Admit: 2019-10-29 | Discharge: 2019-10-30 | DRG: 807 | Disposition: A | Discharge status: Home / Self Care | Payer: BC Managed Care – PPO | Attending: Obstetrics and Gynecology | Admitting: Obstetrics and Gynecology

## 2019-10-29 DIAGNOSIS — O26893 Other specified pregnancy related conditions, third trimester: Secondary | ICD-10-CM | POA: Diagnosis not present

## 2019-10-29 DIAGNOSIS — O99344 Other mental disorders complicating childbirth: Secondary | ICD-10-CM | POA: Diagnosis not present

## 2019-10-29 DIAGNOSIS — Z3A38 38 weeks gestation of pregnancy: Secondary | ICD-10-CM

## 2019-10-29 DIAGNOSIS — O99824 Streptococcus B carrier state complicating childbirth: Secondary | ICD-10-CM | POA: Diagnosis not present

## 2019-10-29 DIAGNOSIS — Z23 Encounter for immunization: Secondary | ICD-10-CM

## 2019-10-29 DIAGNOSIS — F32A Depression, unspecified: Secondary | ICD-10-CM | POA: Diagnosis not present

## 2019-10-29 DIAGNOSIS — Z20822 Contact with and (suspected) exposure to covid-19: Secondary | ICD-10-CM | POA: Diagnosis not present

## 2019-10-29 LAB — TYPE AND SCREEN
ABO/RH(D): A POS
Antibody Screen: NEGATIVE

## 2019-10-29 LAB — CBC
HCT: 36 % (ref 36.0–46.0)
HCT: 35.3 % — ABNORMAL LOW (ref 36.0–46.0)
Hemoglobin: 12.4 g/dL (ref 12.0–15.0)
Hemoglobin: 12.1 g/dL (ref 12.0–15.0)
MCH: 30.2 pg (ref 26.0–34.0)
MCH: 30 pg (ref 26.0–34.0)
MCHC: 34.4 g/dL (ref 30.0–36.0)
MCHC: 34.3 g/dL (ref 30.0–36.0)
MCV: 87.6 fL (ref 80.0–100.0)
MCV: 87.4 fL (ref 80.0–100.0)
Platelets: 195 10*3/uL (ref 150–400)
Platelets: 194 10*3/uL (ref 150–400)
RBC: 4.11 MIL/uL (ref 3.87–5.11)
RBC: 4.04 MIL/uL (ref 3.87–5.11)
RDW: 13.1 % (ref 11.5–15.5)
RDW: 13.1 % (ref 11.5–15.5)
WBC: 9.7 10*3/uL (ref 4.0–10.5)
WBC: 13 10*3/uL — ABNORMAL HIGH (ref 4.0–10.5)
nRBC: 0 % (ref 0.0–0.2)
nRBC: 0 % (ref 0.0–0.2)

## 2019-10-29 LAB — RPR: RPR Ser Ql: NONREACTIVE

## 2019-10-29 LAB — RESPIRATORY PANEL BY RT PCR (FLU A&B, COVID)
Influenza A by PCR: NEGATIVE
Influenza B by PCR: NEGATIVE
SARS Coronavirus 2 by RT PCR: NEGATIVE

## 2019-10-29 MED ORDER — OXYCODONE-ACETAMINOPHEN 5-325 MG PO TABS: 2.0000 | ORAL_TABLET | ORAL | Status: DC | PRN

## 2019-10-29 MED ORDER — BUPIVACAINE HCL (PF) 0.75 % IJ SOLN
INTRAMUSCULAR | Status: DC | PRN
Start: 2019-10-29 — End: 2019-10-29
  Administered 2019-10-29: 12 mL/h via EPIDURAL

## 2019-10-29 MED ORDER — OXYCODONE HCL 5 MG PO TABS: 5.0000 mg | ORAL_TABLET | ORAL | Status: DC | PRN

## 2019-10-29 MED ORDER — BENZOCAINE-MENTHOL 20-0.5 % EX AERO
1.0000 "application " | INHALATION_SPRAY | CUTANEOUS | Status: DC | PRN
  Administered 2019-10-30: 1 via TOPICAL
  Filled 2019-10-29: qty 56

## 2019-10-29 MED ORDER — LACTATED RINGERS IV SOLN: 500.0000 mL | Freq: Once | INTRAVENOUS | Status: DC

## 2019-10-29 MED ORDER — OXYTOCIN-SODIUM CHLORIDE 30-0.9 UT/500ML-% IV SOLN
2.5000 [IU]/h | INTRAVENOUS | Status: DC
  Filled 2019-10-29: qty 500

## 2019-10-29 MED ORDER — LACTATED RINGERS IV SOLN: 500.0000 mL | INTRAVENOUS | Status: DC | PRN

## 2019-10-29 MED ORDER — DIPHENHYDRAMINE HCL 50 MG/ML IJ SOLN: 12.5000 mg | INTRAMUSCULAR | Status: DC | PRN

## 2019-10-29 MED ORDER — SOD CITRATE-CITRIC ACID 500-334 MG/5ML PO SOLN: 30.0000 mL | ORAL | Status: DC | PRN

## 2019-10-29 MED ORDER — ACETAMINOPHEN 325 MG PO TABS: 650.0000 mg | ORAL_TABLET | ORAL | Status: DC | PRN

## 2019-10-29 MED ORDER — ONDANSETRON HCL 4 MG/2ML IJ SOLN: 4.0000 mg | Freq: Four times a day (QID) | INTRAMUSCULAR | Status: DC | PRN

## 2019-10-29 MED ORDER — LACTATED RINGERS IV SOLN: INTRAVENOUS | Status: DC

## 2019-10-29 MED ORDER — PHENYLEPHRINE 40 MCG/ML (10ML) SYRINGE FOR IV PUSH (FOR BLOOD PRESSURE SUPPORT)
80.0000 ug | PREFILLED_SYRINGE | INTRAVENOUS | Status: DC | PRN
  Filled 2019-10-29 (×2): qty 10

## 2019-10-29 MED ORDER — LIDOCAINE HCL (PF) 1 % IJ SOLN: 30.0000 mL | INTRAMUSCULAR | Status: DC | PRN

## 2019-10-29 MED ORDER — EPHEDRINE 5 MG/ML INJ: 10.0000 mg | INTRAVENOUS | Status: DC | PRN

## 2019-10-29 MED ORDER — FENTANYL-BUPIVACAINE-NACL 0.5-0.125-0.9 MG/250ML-% EP SOLN: 12.0000 mL/h | EPIDURAL | Status: DC | PRN

## 2019-10-29 MED ORDER — OXYTOCIN BOLUS FROM INFUSION
333.0000 mL | Freq: Once | INTRAVENOUS | Status: AC
  Administered 2019-10-29: 333 mL via INTRAVENOUS

## 2019-10-29 MED ORDER — FENTANYL-BUPIVACAINE-NACL 0.5-0.125-0.9 MG/250ML-% EP SOLN
12.0000 mL/h | EPIDURAL | Status: DC | PRN
  Filled 2019-10-29: qty 250

## 2019-10-29 MED ORDER — LIDOCAINE HCL (PF) 1 % IJ SOLN
INTRAMUSCULAR | Status: DC | PRN
  Administered 2019-10-29: 6 mL via EPIDURAL

## 2019-10-29 MED ORDER — IBUPROFEN 600 MG PO TABS
600.0000 mg | ORAL_TABLET | Freq: Four times a day (QID) | ORAL | Status: DC
  Administered 2019-10-29 – 2019-10-30 (×6): 600 mg via ORAL
  Filled 2019-10-29 (×6): qty 1

## 2019-10-29 MED ORDER — OXYCODONE HCL 5 MG PO TABS: 10.0000 mg | ORAL_TABLET | ORAL | Status: DC | PRN

## 2019-10-29 MED ORDER — PHENYLEPHRINE 40 MCG/ML (10ML) SYRINGE FOR IV PUSH (FOR BLOOD PRESSURE SUPPORT): 80.0000 ug | PREFILLED_SYRINGE | INTRAVENOUS | Status: DC | PRN

## 2019-10-29 MED ORDER — DIBUCAINE (PERIANAL) 1 % EX OINT: 1.0000 "application " | TOPICAL_OINTMENT | CUTANEOUS | Status: DC | PRN

## 2019-10-29 MED ORDER — OXYCODONE-ACETAMINOPHEN 5-325 MG PO TABS: 1.0000 | ORAL_TABLET | ORAL | Status: DC | PRN

## 2019-10-29 MED ORDER — FLEET ENEMA 7-19 GM/118ML RE ENEM: 1.0000 | ENEMA | RECTAL | Status: DC | PRN

## 2019-10-29 MED ORDER — COCONUT OIL OIL
1.0000 "application " | TOPICAL_OIL | Status: DC | PRN
  Administered 2019-10-30: 1 via TOPICAL

## 2019-10-29 MED ORDER — WITCH HAZEL-GLYCERIN EX PADS: 1.0000 "application " | MEDICATED_PAD | CUTANEOUS | Status: DC | PRN

## 2019-10-29 MED ORDER — DIPHENHYDRAMINE HCL 25 MG PO CAPS: 25.0000 mg | ORAL_CAPSULE | Freq: Four times a day (QID) | ORAL | Status: DC | PRN

## 2019-10-29 MED ORDER — INFLUENZA VAC SPLIT QUAD 0.5 ML IM SUSY
0.5000 mL | PREFILLED_SYRINGE | INTRAMUSCULAR | Status: AC
  Administered 2019-10-30: 0.5 mL via INTRAMUSCULAR
  Filled 2019-10-29: qty 0.5

## 2019-10-29 MED ORDER — TETANUS-DIPHTH-ACELL PERTUSSIS 5-2.5-18.5 LF-MCG/0.5 IM SUSY: 0.5000 mL | PREFILLED_SYRINGE | Freq: Once | INTRAMUSCULAR | Status: DC

## 2019-10-29 MED ORDER — ZOLPIDEM TARTRATE 5 MG PO TABS: 5.0000 mg | ORAL_TABLET | Freq: Every evening | ORAL | Status: DC | PRN

## 2019-10-29 MED ORDER — PRENATAL MULTIVITAMIN CH
1.0000 | ORAL_TABLET | Freq: Every day | ORAL | Status: DC
  Administered 2019-10-29 – 2019-10-30 (×2): 1 via ORAL
  Filled 2019-10-29 (×2): qty 1

## 2019-10-29 MED ORDER — SODIUM CHLORIDE 0.9 % IV SOLN
2.0000 g | Freq: Once | INTRAVENOUS | Status: DC
  Filled 2019-10-29: qty 2000

## 2019-10-29 MED ORDER — SENNOSIDES-DOCUSATE SODIUM 8.6-50 MG PO TABS
2.0000 | ORAL_TABLET | ORAL | Status: DC
  Filled 2019-10-29: qty 2

## 2019-10-29 MED ORDER — SODIUM CHLORIDE 0.9 % IV SOLN
1.0000 g | INTRAVENOUS | Status: DC
  Filled 2019-10-29 (×3): qty 1000

## 2019-10-29 MED ORDER — SIMETHICONE 80 MG PO CHEW: 80.0000 mg | CHEWABLE_TABLET | ORAL | Status: DC | PRN

## 2019-10-29 MED ORDER — ONDANSETRON HCL 4 MG/2ML IJ SOLN: 4.0000 mg | INTRAMUSCULAR | Status: DC | PRN

## 2019-10-29 MED ORDER — FLUOXETINE HCL 20 MG PO CAPS
40.0000 mg | ORAL_CAPSULE | Freq: Every day | ORAL | Status: DC
  Administered 2019-10-29: 40 mg via ORAL
  Filled 2019-10-29: qty 2

## 2019-10-29 MED ORDER — ONDANSETRON HCL 4 MG PO TABS: 4.0000 mg | ORAL_TABLET | ORAL | Status: DC | PRN

## 2019-10-29 NOTE — Progress Notes (Signed)
Delivery Note At 2:41 AM a viable female was delivered via Vaginal, Spontaneous (Presentation: Left Occiput Anterior).  APGAR: , ; weight  .   Placenta status: Spontaneous, Intact.  Cord: 3 vessels with the following complications: None.  Cord pH:   Anesthesia: Epidural Episiotomy: None Lacerations:  Second degree ML lac repaired Suture Repair: 2.0 vicryl rapide Est. Blood Loss (mL):  250  Mom to postpartum.  Baby to Couplet care / Skin to Skin.  Melanie Oneill 10/29/2019, 2:58 AM

## 2019-10-29 NOTE — MAU Note (Signed)
Pt reports some bloody discharge, contractions q 5 minutes. Reports good fetal movement

## 2019-10-29 NOTE — H&P (Signed)
Melanie Oneill is a 29 y.o. female presenting for UCs. Pregnancy complicated by depression on fluoxetine. OB History    Gravida  3   Para  1   Term  1   Preterm      AB  1   Living  1     SAB      TAB      Ectopic  1   Multiple  0   Live Births  1          Past Medical History:  Diagnosis Date  . Abnormal Pap smear of cervix 07/21/2015   ASCUS cant rule out high grade  . Allergy   . Anxiety   . Depression    doing ok  . Headache   . IBS (irritable bowel syndrome)   . Inverse psoriasis    Past Surgical History:  Procedure Laterality Date  . CHROMOPERTUBATION N/A 10/25/2016   Procedure: CHROMOPERTUBATION;  Surgeon: Patton Salles, MD;  Location: [facility];  Service: Gynecology;  Laterality: N/A;  . COLPOSCOPY  08/2015   LGSIL  . DIAGNOSTIC LAPAROSCOPY WITH REMOVAL OF ECTOPIC PREGNANCY N/A 10/25/2016   Procedure: DIAGNOSTIC LAPAROSCOPY WITH REMOVAL OF ECTOPIC PREGNANCY;  Surgeon: Patton Salles, MD;  Location: [facility];  Service: Gynecology;  Laterality: N/A;  . DILATION AND EVACUATION N/A 10/25/2016   Procedure: DILATATION AND EVACUATION;  Surgeon: Patton Salles, MD;  Location: [facility];  Service: Gynecology;  Laterality: N/A;  . nexplanon insertion     10-22-13, removed 05-17-16  . TONSILLECTOMY    . UNILATERAL SALPINGECTOMY Right 10/25/2016   Procedure: UNILATERAL SALPINGECTOMY;  Surgeon: Patton Salles, MD;  Location: [facility];  Service: Gynecology;  Laterality: Right;  . WISDOM TOOTH EXTRACTION     Family History: family history includes Asthma in her brother; Cancer in her father and maternal grandmother; Crohn's disease in her paternal grandfather; Healthy in her mother; Hypertension in her father, paternal grandfather, and paternal grandmother. Social History:  reports that she has never smoked. She has never used smokeless tobacco. She reports previous alcohol use. She reports that she does not use  drugs.     Maternal Diabetes: No Genetic Screening: Normal Maternal Ultrasounds/Referrals: Normal Fetal Ultrasounds or other Referrals:  None Maternal Substance Abuse:  No Significant Maternal Medications:  None Significant Maternal Lab Results:  Group B Strep positive Other Comments:  None  Review of Systems Maternal Medical History:  Reason for admission: Contractions.     Dilation: 7 Effacement (%): 80 Station: -1 Exam by:: R Schmidt RN Blood pressure 99/68, pulse 91, temperature 98.6 F (37 C), resp. rate 17, height 5\' 8"  (1.727 m), weight 90.3 kg, SpO2 100 %, unknown if currently breastfeeding.   Fetal Exam Fetal State Assessment: Category I - tracings are normal.     Physical Exam Cardiovascular:     Rate and Rhythm: Normal rate.  Pulmonary:     Effort: Pulmonary effort is normal.     Prenatal labs: ABO, Rh:   Antibody:   Rubella:   RPR:    HBsAg:    HIV:    GBS:   positive  Assessment/Plan: 29 yo G3P1 in active labor Ampicillin ordered   37 II 10/29/2019, 1:21 AM

## 2019-10-29 NOTE — Anesthesia Preprocedure Evaluation (Signed)
Anesthesia Evaluation  Patient identified by MRN, date of birth, ID band Patient awake    Reviewed: Allergy & Precautions, H&P , NPO status , Patient's Chart, lab work & pertinent test results, reviewed documented beta blocker date and time   Airway Mallampati: II  TM Distance: >3 FB Neck ROM: full    Dental no notable dental hx. (+) Teeth Intact, Dental Advisory Given   Pulmonary neg pulmonary ROS,    Pulmonary exam normal breath sounds clear to auscultation       Cardiovascular negative cardio ROS Normal cardiovascular exam Rhythm:regular Rate:Normal     Neuro/Psych negative neurological ROS  negative psych ROS   GI/Hepatic negative GI ROS, Neg liver ROS,   Endo/Other  negative endocrine ROS  Renal/GU negative Renal ROS  negative genitourinary   Musculoskeletal   Abdominal   Peds  Hematology negative hematology ROS (+)   Anesthesia Other Findings   Reproductive/Obstetrics (+) Pregnancy                             Anesthesia Physical Anesthesia Plan  ASA: II  Anesthesia Plan: Epidural   Post-op Pain Management:    Induction:   PONV Risk Score and Plan:   Airway Management Planned: Natural Airway  Additional Equipment:   Intra-op Plan:   Post-operative Plan:   Informed Consent: I have reviewed the patients History and Physical, chart, labs and discussed the procedure including the risks, benefits and alternatives for the proposed anesthesia with the patient or authorized representative who has indicated his/her understanding and acceptance.       Plan Discussed with: Anesthesiologist  Anesthesia Plan Comments:         Anesthesia Quick Evaluation

## 2019-10-29 NOTE — Anesthesia Procedure Notes (Signed)
Epidural Patient location during procedure: OB Start time: 10/29/2019 1:51 AM End time: 10/29/2019 1:55 AM  Staffing Anesthesiologist: Bethena Midget, MD  Preanesthetic Checklist Completed: patient identified, IV checked, site marked, risks and benefits discussed, surgical consent, monitors and equipment checked, pre-op evaluation and timeout performed  Epidural Patient position: sitting Prep: DuraPrep and site prepped and draped Patient monitoring: continuous pulse ox and blood pressure Approach: midline Location: L4-L5 Injection technique: LOR air  Needle:  Needle type: Tuohy  Needle gauge: 17 G Needle length: 9 cm and 9 Needle insertion depth: 5 cm cm Catheter type: closed end flexible Catheter size: 19 Gauge Catheter at skin depth: 10 cm Test dose: negative  Assessment Events: blood not aspirated, injection not painful, no injection resistance, no paresthesia and negative IV test

## 2019-10-29 NOTE — Anesthesia Postprocedure Evaluation (Signed)
Anesthesia Post Note  Patient: Melanie Oneill  Procedure(s) Performed: AN AD HOC LABOR EPIDURAL     Patient location during evaluation: Mother Baby Anesthesia Type: Epidural Level of consciousness: awake, awake and alert and oriented Pain management: pain level controlled Vital Signs Assessment: post-procedure vital signs reviewed and stable Respiratory status: spontaneous breathing and respiratory function stable Cardiovascular status: blood pressure returned to baseline Postop Assessment: no headache, epidural receding, patient able to bend at knees, adequate PO intake, no backache, no apparent nausea or vomiting and able to ambulate Anesthetic complications: no   No complications documented.  Last Vitals:  Vitals:   10/29/19 0550 10/29/19 1004  BP: 119/67 114/71  Pulse: 74   Resp: 18 18  Temp: 36.9 C 36.9 C  SpO2: 97%     Last Pain:  Vitals:   10/29/19 1004  TempSrc: Oral  PainSc: 0-No pain   Pain Goal:                   Cleda Clarks

## 2019-10-29 NOTE — Social Work (Signed)
MOB was referred for history of depression/anxiety.   * Referral screened out by Clinical Social Worker because none of the following criteria appear to apply:  ~ History of anxiety/depression during this pregnancy, or of post-partum depression following prior delivery. ~ Diagnosis of anxiety and/or depression within last 3 years OR * MOB's symptoms currently being treated with medication and/or therapy. Per chart review, MOB being treated Fluoxetine.  Please contact the Clinical Social Worker if needs arise, by [facility] request, or if MOB scores greater than 9/yes to question 10 on [place] Postpartum Depression Screen.  Manfred Arch, LCSWA Clinical Social Work [employer] and [employer]  [phone]

## 2019-10-30 ENCOUNTER — Ambulatory Visit: Payer: Self-pay

## 2019-10-30 ENCOUNTER — Encounter (HOSPITAL_COMMUNITY): Payer: Self-pay | Admitting: Obstetrics and Gynecology

## 2019-10-30 NOTE — Lactation Note (Signed)
This note was copied from a baby's chart. Lactation Consultation Note  Patient Name: [name] [id number] Date: 10/30/2019 Reason for consult: Early term 37-38.6wks P2, 44 hour ETI female infant. Mom with hx:  IBS, anxiety, depression on fluoxetine.  Infant had 4 voids and 9 stools since birth.Per mom, she has DEBP at home. Per mom, she really wants to BF infant, she had latch difficulties with her son, he did not latch well was in NICU.  Per mom, she is having breast pain with latch, LC noticed a nipple bleeb on mom's left breast. Mom knows to use coconut oil or EBM to help with breast soreness.  LC entered room, mom had infant latched in cradle position with boopy and mom broke latch and her nipple was pinched. Mom open to [facility] suggestions, pillows were placed under infant, mom latched infant on her right breast using the football hold, infant latched with depth, per mom,  she feels a tug but not pain, infant was still BF after 12 minutes when LC left the room. Mom knows to call RN or LC services to assist with latching infant to the breast if needed.  Mom knows to BF infant according to cues, 8 to 12 times within 24 hours, STS. Mom made aware of O/P services, breastfeeding support groups, community resources, and our phone # for post-discharge questions.    Maternal Data Formula Feeding for Exclusion: No Has patient been taught Hand Expression?: Yes Does the patient have breastfeeding experience prior to this delivery?: Yes  Feeding Feeding Type: Breast Fed  LATCH Score Latch: Grasps breast easily, tongue down, lips flanged, rhythmical sucking.  Audible Swallowing: Spontaneous and intermittent  Type of Nipple: Everted at rest and after stimulation  Comfort (Breast/Nipple): Filling, red/small blisters or bruises, mild/mod discomfort  Hold (Positioning): Assistance needed to correctly position infant at breast and maintain latch.  LATCH Score:  8  Interventions Interventions: Breast feeding basics reviewed;Breast compression;Adjust position;Assisted with latch;Skin to skin;Support pillows;Position options;Breast massage;Hand express;Expressed milk  Lactation Tools Discussed/Used WIC Program: No   Consult Status Consult Status: Follow-up Date: 10/31/19 Follow-up type: In-patient    Danelle Earthly 10/30/2019, 11:32 PM

## 2019-10-30 NOTE — Discharge Summary (Addendum)
Obstetric Discharge Summary  Melanie Oneill is a 29 y.o. female that presented on 10/29/2019 for contractions.  She was admitted to labor and delivery for labor.  Her labor course was uncomplicated and she delivered a viable female infant on 10/29/2019.  Her postpartum course was uncomplicated and on PPD#1, she reported well controlled pain, spontaneous voiding, ambulating without difficulty, and tolerating PO.  She was stable for discharge home on 10/29/2020 with plans for in-office follow up.  Hemoglobin  Date Value Ref Range Status  10/29/2019 12.1 12.0 - 15.0 g/dL Final  82/95/6213 08.6 11.1 - 15.9 g/dL Final   Hemoglobin, fingerstick  Date Value Ref Range Status  06/26/2013 12.6 12.0 - 16.0 g/dL Final   HCT  Date Value Ref Range Status  10/29/2019 35.3 (L) 36 - 46 % Final   Hematocrit  Date Value Ref Range Status  11/08/2016 37.4 34.0 - 46.6 % Final    Physical Exam:  General: alert and no distress Lochia: appropriate Uterine Fundus: firm DVT Evaluation: No evidence of DVT seen on physical exam.  Discharge Diagnoses: Term Pregnancy-delivered  Discharge Information: Date: 10/30/2019 Activity: as tolerated Diet: routine Medications: None Condition: stable Instructions: refer to practice specific booklet Discharge to: home   Newborn Data: Live born female  Birth Weight: 7 lb 10.4 oz (3470 g) APGAR: 8, 9  Newborn Delivery   Birth date/time: 10/29/2019 02:41:00 Delivery type: Vaginal, Spontaneous      Home with mother.  Lyn Henri 10/30/2019, 9:05 AM

## 2019-10-30 NOTE — Progress Notes (Signed)
Postpartum Progress Note  Post Partum Day 1 s/p spontaneous vaginal delivery.  Patient reports well-controlled pain, ambulating without difficulty, voiding spontaneously, tolerating PO.  Vaginal bleeding is appropriate.   Objective: Blood pressure 105/72, pulse 64, temperature 98.1 F (36.7 C), temperature source Oral, resp. rate 18, height 5\' 8"  (1.727 m), weight 90.3 kg, SpO2 100 %, unknown if currently breastfeeding.  Physical Exam:  General: alert and no distress Lochia: appropriate Uterine Fundus: firm DVT Evaluation: No evidence of DVT seen on physical exam.  Recent Labs    10/29/19 0112 10/29/19 0608  HGB 12.4 12.1  HCT 36.0 35.3*    Assessment/Plan: . Postpartum Day 1, s/p vaginal delivery ~3 AM . Continue routine postpartum care . Lactation following . Anticipate discharge home today if baby OK for discharge.    LOS: 1 day   10/31/19 10/30/2019, 7:23 AM

## 2019-10-31 ENCOUNTER — Ambulatory Visit: Payer: Self-pay

## 2019-10-31 NOTE — Lactation Note (Signed)
This note was copied from a baby's chart. Lactation Consultation Note  Patient Name: Melanie Oneill [id number] Date: 10/31/2019 Reason for consult: Follow-up assessment Mother is a P2, infant is 17 hours old..   Mother reports that infant is feeding well.  Mother reports that she has a sore nipple with  trama on the left nipple.   Mother was given comfort gels and advised to get RX for [facility]. Encouraged to rotate positions frequently .   Mother to continue to cue base feed infant and feed at least 8-12 times or more in 24 hours and advised to allow for cluster feeding infant as needed.  Mother to continue to due STS. Mother is aware of available LC services at [facility], BFSG'S, OP Dept, and phone # for questions or concerns about breastfeeding.  Mother receptive to all teaching and plan of care.    Maternal Data    Feeding Feeding Type: Breast Fed  LATCH Score                   Interventions Interventions: Hand express;Comfort gels  Lactation Tools Discussed/Used     Consult Status Consult Status: Complete    Michel Bickers 10/31/2019, 11:03 AM

## 2019-11-04 ENCOUNTER — Inpatient Hospital Stay (HOSPITAL_COMMUNITY): Payer: BC Managed Care – PPO

## 2019-11-04 ENCOUNTER — Inpatient Hospital Stay (HOSPITAL_COMMUNITY)
Admission: AD | Admit: 2019-11-04 | Payer: BC Managed Care – PPO | Source: Home / Self Care | Admitting: Obstetrics and Gynecology

## 2019-11-11 ENCOUNTER — Encounter: Payer: Self-pay | Admitting: Obstetrics & Gynecology

## 2019-12-11 DIAGNOSIS — Z1389 Encounter for screening for other disorder: Secondary | ICD-10-CM | POA: Diagnosis not present

## 2021-12-17 ENCOUNTER — Other Ambulatory Visit (HOSPITAL_BASED_OUTPATIENT_CLINIC_OR_DEPARTMENT_OTHER): Payer: Self-pay

## 2021-12-17 DIAGNOSIS — G478 Other sleep disorders: Secondary | ICD-10-CM

## 2021-12-17 DIAGNOSIS — R5383 Other fatigue: Secondary | ICD-10-CM

## 2022-02-18 ENCOUNTER — Other Ambulatory Visit: Payer: Self-pay

## 2022-02-18 ENCOUNTER — Encounter (HOSPITAL_BASED_OUTPATIENT_CLINIC_OR_DEPARTMENT_OTHER): Payer: Self-pay

## 2022-02-18 ENCOUNTER — Emergency Department (HOSPITAL_BASED_OUTPATIENT_CLINIC_OR_DEPARTMENT_OTHER)
Admission: EM | Admit: 2022-02-18 | Discharge: 2022-02-18 | Disposition: A | Discharge status: Home / Self Care | Payer: 59 | Attending: Emergency Medicine | Admitting: Emergency Medicine

## 2022-02-18 ENCOUNTER — Emergency Department (HOSPITAL_BASED_OUTPATIENT_CLINIC_OR_DEPARTMENT_OTHER): Payer: 59 | Admitting: Radiology

## 2022-02-18 DIAGNOSIS — F419 Anxiety disorder, unspecified: Secondary | ICD-10-CM | POA: Insufficient documentation

## 2022-02-18 LAB — COMPREHENSIVE METABOLIC PANEL
ALT: 17 U/L (ref 0–44)
AST: 17 U/L (ref 15–41)
Albumin: 4.6 g/dL (ref 3.5–5.0)
Alkaline Phosphatase: 52 U/L (ref 38–126)
Anion gap: 8 (ref 5–15)
BUN: 9 mg/dL (ref 6–20)
CO2: 26 mmol/L (ref 22–32)
Calcium: 9.9 mg/dL (ref 8.9–10.3)
Chloride: 104 mmol/L (ref 98–111)
Creatinine, Ser: 0.75 mg/dL (ref 0.44–1.00)
GFR, Estimated: >60 mL/min (ref >60)
Glucose, Bld: 102 mg/dL — ABNORMAL HIGH (ref 70–99)
Potassium: 3.9 mmol/L (ref 3.5–5.1)
Sodium: 138 mmol/L (ref 135–145)
Total Bilirubin: 0.3 mg/dL (ref 0.3–1.2)
Total Protein: 7.3 g/dL (ref 6.5–8.1)

## 2022-02-18 LAB — URINALYSIS, ROUTINE W REFLEX MICROSCOPIC
Bacteria, UA: NONE SEEN
Bilirubin Urine: NEGATIVE
Glucose, UA: NEGATIVE mg/dL
Hgb urine dipstick: NEGATIVE
Ketones, ur: NEGATIVE mg/dL
Nitrite: NEGATIVE
Protein, ur: NEGATIVE mg/dL
Specific Gravity, Urine: 1.023 (ref 1.005–1.030)
pH: 6.5 (ref 5.0–8.0)

## 2022-02-18 LAB — CBC WITH DIFFERENTIAL/PLATELET
Abs Immature Granulocytes: 0.01 10*3/uL (ref 0.00–0.07)
Basophils Absolute: 0 10*3/uL (ref 0.0–0.1)
Basophils Relative: 0 %
Eosinophils Absolute: 0 10*3/uL (ref 0.0–0.5)
Eosinophils Relative: 0 %
HCT: 37 % (ref 36.0–46.0)
Hemoglobin: 13.1 g/dL (ref 12.0–15.0)
Immature Granulocytes: 0 %
Lymphocytes Relative: 32 %
Lymphs Abs: 2.1 10*3/uL (ref 0.7–4.0)
MCH: 31.3 pg (ref 26.0–34.0)
MCHC: 35.4 g/dL (ref 30.0–36.0)
MCV: 88.5 fL (ref 80.0–100.0)
Monocytes Absolute: 0.3 10*3/uL (ref 0.1–1.0)
Monocytes Relative: 5 %
Neutro Abs: 4.3 10*3/uL (ref 1.7–7.7)
Neutrophils Relative %: 63 %
Platelets: 223 10*3/uL (ref 150–400)
RBC: 4.18 MIL/uL (ref 3.87–5.11)
RDW: 11.8 % (ref 11.5–15.5)
WBC: 6.8 10*3/uL (ref 4.0–10.5)
nRBC: 0 % (ref 0.0–0.2)

## 2022-02-18 LAB — TSH: TSH: 1.386 u[IU]/mL (ref 0.350–4.500)

## 2022-02-18 LAB — PREGNANCY, URINE: Preg Test, Ur: NEGATIVE

## 2022-02-18 LAB — TROPONIN I (HIGH SENSITIVITY): Troponin I (High Sensitivity): <2 ng/L (ref <18)

## 2022-02-18 MED ORDER — HYDROXYZINE HCL 25 MG PO TABS: 25.0000 mg | ORAL_TABLET | Freq: Four times a day (QID) | ORAL | 0 refills | Status: AC

## 2022-02-18 MED ORDER — LORAZEPAM 1 MG PO TABS
1.0000 mg | ORAL_TABLET | Freq: Once | ORAL | Status: AC
  Administered 2022-02-18: 1 mg via ORAL
  Filled 2022-02-18: qty 1

## 2022-02-18 NOTE — Discharge Instructions (Addendum)
You were seen in the ER today for evaluation after your anxiety and panic attack. Your labs were unremarkable as was your X ray and EKG. I would like for you to follow up with your psychiatrist on Monday to let them know of you increased anxiety. I have included the information for the [facility] to the discharge paperwork for you to follow up with as needed. I am sending you home with a medication called hydroxyzine to help as needed. If you have any concerns, new or worsening symptoms, please return to the nearest ER for re-evaluation.   Contact a health care provider if: You have a hard time staying focused or finishing daily tasks. You spend many hours a day feeling worried about everyday life. You become exhausted by worry. You start to have headaches or frequently feel tense. You develop chronic nausea or diarrhea. Get help right away if: You have a racing heart and shortness of breath. You have thoughts of hurting yourself or others. If you ever feel like you may hurt yourself or others, or have thoughts about taking your own life, get help right away. Go to your nearest emergency department or: Call your local emergency services (911 in the [place] Call a suicide crisis helpline, such as the [place] at 684-818-5794 or 988 in the [place]. This is open 24 hours a day in the [place] Text the Crisis Text Line at 418-132-3840 (in the [place].).

## 2022-02-18 NOTE — ED Provider Notes (Signed)
[facility] Provider Note   CSN: [id number] Arrival date & time: 02/18/22  1321     History Chief Complaint  Patient presents with   Anxiety    Melanie Oneill is a 32 y.o. female anxiety and depression presents to the ER for evaluation anxiety. 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  She reports that she was starting to feel more anxious being around a lot of people.  She reports that on Friday she felt more fatigued and nauseous and has been sleeping a lot more recently.  She reports that she has been feeling anxiety attack building from last night until that day.  She reports that she keeps dry heaving from her nausea but is not actually vomiting.  She denies any abdominal pain or any flulike symptoms.  She reports that she was hyperventilating last night from anxiety but feels little better today.  She reports that she feels really overwhelmed and overstimulated by her clothing and was going on around her.  She denies any suicidal or homicidal ideations.  She denies any auditory visual hallucinations.  She is currently on venlafaxine and lamotrigine.  She is followed up by psychiatrist.  No known drug allergies.  She denies any tobacco, EtOH, also drug use.   Anxiety Associated symptoms include chest pain and shortness of breath. Pertinent negatives include no abdominal pain.       Home Medications Prior to Admission medications   Medication Sig Start Date End Date Taking? Authorizing Provider  aluminum hydroxide-magnesium carbonate (GAVISCON) 95-358 MG/15ML SUSP Take by mouth as needed.    [provider]  Calcium Carbonate Antacid (ALKA-SELTZER ANTACID PO) Take 1 tablet by mouth 2 (two) times daily as needed (heartburn).    [provider]  fexofenadine (ALLEGRA) 180 MG tablet Take 180 mg by mouth every evening.    [provider]  FLUoxetine (PROZAC) 40 MG capsule Take 40 mg by mouth at bedtime.  07/15/15   [provider]  hydrocortisone cream 1 % Apply 1 application topically daily as needed for itching (psoriasis).    [provider]  Prenatal Vit-Fe Fumarate-FA (PRENATAL MULTIVITAMIN) TABS tablet Take 1 tablet by mouth at bedtime.    [provider]      Allergies    Wheat bran    Review of Systems   Review of Systems  Constitutional:  Negative for chills and fever.  HENT:  Negative for congestion and sore throat.   Respiratory:  Positive for shortness of breath.   Cardiovascular:  Positive for chest pain.  Gastrointestinal:  Positive for nausea. Negative for abdominal pain, constipation, diarrhea and vomiting.  Genitourinary:  Negative for dysuria and hematuria.  Psychiatric/Behavioral:  Negative for hallucinations, self-injury and suicidal ideas. The patient is nervous/anxious.     Physical Exam Updated Vital Signs BP 109/73 (BP Location: Right Arm)   Pulse 75   Temp 98.4 F (36.9 C)   Resp 18   SpO2 99%  Physical Exam Vitals and nursing note reviewed.  Constitutional:      Appearance: She is not toxic-appearing.     Comments: Tearful, anxious appearing, fiddling with her hands.  HENT:     Mouth/Throat:     Mouth: Mucous membranes are moist.  Eyes:     General: No scleral icterus. Pulmonary:     Effort: Pulmonary effort is normal. No respiratory distress.  Skin:  General: Skin is dry.     Findings: No rash.  Neurological:     General: No focal deficit present.     Mental Status: She is alert. Mental status is at baseline.  Psychiatric:        Attention and Perception: She does not perceive auditory or visual hallucinations.        Mood and Affect: Mood is anxious.        Thought Content: Thought content does not include homicidal or suicidal ideation. Thought content does not include homicidal or suicidal plan.     Comments: Tearful, fidgeting  with hands. Cooperative with exam. Tremulous.     ED Results / Procedures / Treatments   Labs (all labs ordered are listed, but only abnormal results are displayed) Labs Reviewed  PREGNANCY, URINE  CBC WITH DIFFERENTIAL/PLATELET  COMPREHENSIVE METABOLIC PANEL  URINALYSIS, ROUTINE W REFLEX MICROSCOPIC  TSH  T4, FREE  TROPONIN I (HIGH SENSITIVITY)    EKG EKG Interpretation  Date/Time:  Saturday February 18 2022 15:53:42 EST Ventricular Rate:  78 PR Interval:  155 QRS Duration: 108 QT Interval:  389 QTC Calculation: 444 R Axis:   84 Text Interpretation: Sinus rhythm Low voltage, precordial leads Confirmed by Octaviano Glow [phone]) on 02/18/2022 4:09:15 PM  Radiology DG Chest 2 View  Result Date: 02/18/2022 CLINICAL DATA:  32 year old female with history of chest pain. EXAM: CHEST - 2 VIEW COMPARISON:  No priors. FINDINGS: Lung volumes are normal. No consolidative airspace disease. No pleural effusions. No pneumothorax. No pulmonary nodule or mass noted. Pulmonary vasculature and the cardiomediastinal silhouette are within normal limits. IMPRESSION: No radiographic evidence of acute cardiopulmonary disease. Electronically Signed   By: Vinnie Langton M.D.   On: 02/18/2022 15:56    Procedures Procedures   Medications Ordered in ED Medications  LORazepam (ATIVAN) tablet 1 mg (1 mg Oral Given 02/18/22 1556)    ED Course/ Medical Decision Making/ A&P                           Medical Decision Making Amount and/or Complexity of Data Reviewed Labs: ordered. Radiology: ordered.  Risk Prescription drug management.   32 year old female presents emergency room today for evaluation of anxiety and panic attack increased.  Differential diagnosis includes but is not limited to hyperthyroidism, electrolyte abnormality, anxiety, pain disorder, psych, ACS, pericarditis, myocarditis, aortic dissection, PE, pneumothorax, esophageal spasm or rupture, chronic angina, pneumonia,  bronchitis, GERD, reflux/PUD, biliary disease, pancreatitis, costochondritis, anxiety. Vital signs unremarkable.  Patient normotensive, afebrile, pulse rate, satting well on room air without increased work breathing.  Physical exam as noted above.  Labs ordered.  I ordered her some Ativan as well as her husband will be driving her home.  I independently reviewed and interpreted the patient's labs.  CBC without leukocytosis or anemia.  Pregnancy test is negative.  Urinalysis does show small amount leukocytes with 21-50 white blood cells however there is 11-20 squamous epithelials.  This is a dirty catch.  Troponin is <2.  TSH is WNL.  CMP shows mildly elevated glucose, otherwise unremarkable.  Chest x-ray shows no acute cardiopulmonary process.  EKG reviewed and interpreted by my attending and shows sinus rhythm.   On reevaluation, the patient is well appearing. She appears calm and in no acute distress. The patient reports that she is feeling much better.   At this time, she does not have any thyroid abnormality or electrolyte abnormality. CXR unremarkable. Doubt any ACS,  PNA, PE, PNX.  She has an established psychiatrist. I recommended following up with them on Monday. [place] information given as well. Will also prescribe the patient some hydroxyzine as needed. At this time, the patient is adamant that she does not have any SI or HI. No AVH. Good support at home. She reports that she feels good enough to go home. We discussed strict return precautions and red flag symptoms. The patient verbalizes her understanding and agrees to the plan. The patient is stable and is being discharged home in good condition.   Final Clinical Impression(s) / ED Diagnoses Final diagnoses:  Anxiety    Rx / DC Orders ED Discharge Orders          Ordered    hydrOXYzine (ATARAX) 25 MG tablet  Every 6 hours        02/18/22 1713              Sherrell Puller, PA-C 02/20/22 1435    Wyvonnia Dusky,  MD 02/20/22 1726

## 2022-02-18 NOTE — ED Triage Notes (Signed)
Pt states she feels she is having "a mental breakdown." Hx anxiety, depression, compliant w medication. Endorses shorter temper, volatile mood swings, fatigue, nausea/ dry heaves.   States it started approx 5p yesterday.

## 2022-02-19 LAB — T4, FREE: Free T4: 0.79 ng/dL (ref 0.61–1.12)
